# Patient Record
Sex: Female | Born: 1935 | Race: White | Hispanic: No | Marital: Married | Smoking: Never smoker
Health system: Southern US, Community
[De-identification: ages and names within clinical notes are randomized; demographics above are authoritative.]

## PROBLEM LIST (undated history)

## (undated) DIAGNOSIS — N183 Chronic kidney disease, stage 3 unspecified: Secondary | ICD-10-CM

## (undated) DIAGNOSIS — I340 Nonrheumatic mitral (valve) insufficiency: Secondary | ICD-10-CM

## (undated) DIAGNOSIS — I5032 Chronic diastolic (congestive) heart failure: Secondary | ICD-10-CM

## (undated) DIAGNOSIS — M1990 Unspecified osteoarthritis, unspecified site: Secondary | ICD-10-CM

## (undated) DIAGNOSIS — I272 Pulmonary hypertension, unspecified: Secondary | ICD-10-CM

## (undated) DIAGNOSIS — I10 Essential (primary) hypertension: Secondary | ICD-10-CM

## (undated) DIAGNOSIS — S22001A Stable burst fracture of unspecified thoracic vertebra, initial encounter for closed fracture: Secondary | ICD-10-CM

## (undated) DIAGNOSIS — Z85828 Personal history of other malignant neoplasm of skin: Secondary | ICD-10-CM

## (undated) DIAGNOSIS — E7800 Pure hypercholesterolemia, unspecified: Secondary | ICD-10-CM

## (undated) DIAGNOSIS — I4819 Other persistent atrial fibrillation: Secondary | ICD-10-CM

## (undated) DIAGNOSIS — I071 Rheumatic tricuspid insufficiency: Secondary | ICD-10-CM

## (undated) DIAGNOSIS — K5730 Diverticulosis of large intestine without perforation or abscess without bleeding: Secondary | ICD-10-CM

## (undated) DIAGNOSIS — I351 Nonrheumatic aortic (valve) insufficiency: Secondary | ICD-10-CM

## (undated) DIAGNOSIS — E611 Iron deficiency: Secondary | ICD-10-CM

## (undated) DIAGNOSIS — Z9889 Other specified postprocedural states: Secondary | ICD-10-CM

## (undated) HISTORY — PX: HIP SURGERY: SHX245

## (undated) HISTORY — PX: ABDOMINAL HYSTERECTOMY: SHX81

## (undated) HISTORY — DX: Nonrheumatic mitral (valve) insufficiency: I34.0

## (undated) HISTORY — DX: Nonrheumatic aortic (valve) insufficiency: I35.1

## (undated) HISTORY — DX: Rheumatic tricuspid insufficiency: I07.1

## (undated) HISTORY — DX: Chronic diastolic (congestive) heart failure: I50.32

---

## 2011-06-12 HISTORY — PX: TOTAL KNEE ARTHROPLASTY: SHX125

## 2011-11-14 ENCOUNTER — Emergency Department (HOSPITAL_BASED_OUTPATIENT_CLINIC_OR_DEPARTMENT_OTHER)
Admission: EM | Admit: 2011-11-14 | Discharge: 2011-11-14 | Disposition: A | Discharge status: Home / Self Care | Payer: Medicare Other | Attending: Emergency Medicine | Admitting: Emergency Medicine

## 2011-11-14 ENCOUNTER — Emergency Department (HOSPITAL_BASED_OUTPATIENT_CLINIC_OR_DEPARTMENT_OTHER): Payer: Medicare Other

## 2011-11-14 ENCOUNTER — Encounter (HOSPITAL_BASED_OUTPATIENT_CLINIC_OR_DEPARTMENT_OTHER): Payer: Self-pay | Admitting: *Deleted

## 2011-11-14 DIAGNOSIS — I6789 Other cerebrovascular disease: Secondary | ICD-10-CM | POA: Insufficient documentation

## 2011-11-14 DIAGNOSIS — M545 Low back pain, unspecified: Secondary | ICD-10-CM | POA: Insufficient documentation

## 2011-11-14 DIAGNOSIS — S0510XA Contusion of eyeball and orbital tissues, unspecified eye, initial encounter: Secondary | ICD-10-CM | POA: Insufficient documentation

## 2011-11-14 DIAGNOSIS — W06XXXA Fall from bed, initial encounter: Secondary | ICD-10-CM | POA: Insufficient documentation

## 2011-11-14 DIAGNOSIS — W19XXXA Unspecified fall, initial encounter: Secondary | ICD-10-CM

## 2011-11-14 DIAGNOSIS — Y92009 Unspecified place in unspecified non-institutional (private) residence as the place of occurrence of the external cause: Secondary | ICD-10-CM | POA: Insufficient documentation

## 2011-11-14 HISTORY — DX: Other specified postprocedural states: Z98.890

## 2011-11-14 HISTORY — DX: Pure hypercholesterolemia, unspecified: E78.00

## 2011-11-14 HISTORY — DX: Unspecified osteoarthritis, unspecified site: M19.90

## 2011-11-14 NOTE — ED Notes (Signed)
Patient states she was sleeping and rolled out of the bed Thursday morning and fell on face. Came in today because her lower back continues to hurt. States her face does not hurt but has bruising. No loc, not dizzy. No meds taken for pain

## 2011-11-14 NOTE — ED Provider Notes (Signed)
History     CSN: [id number]  Arrival date & time 11/14/11  1102   First MD Initiated Contact with Patient 11/14/11 1116      Chief Complaint  Patient presents with  . Fall    (Consider location/radiation/quality/duration/timing/severity/associated sxs/prior treatment) HPI Comments: The patient is an 78 year old woman who fell out of bed 2 days ago. She didn't feel like she was her significantly and did not seek evaluation then. She followed flat on her face. She was not unconscious. She noted bruising around her right orbit. Now she has bad pain, like spasms, and her lower back. It is noteworthy that she is on Coumadin for atrial fibrillation. She is also undergoing treatment for hypertension and high cholesterol. She has had bilateral total hip surgery, and recently, in March of this year, had right total knee surgery.  Patient is a 78 y.o. female presenting with fall.  Fall The accident occurred 2 days ago. Incident: Rolled out of bed and fell at home. She fell from a height of 1 to 2 ft. She landed on carpet. Point of impact: Face, back. The pain is moderate. She was ambulatory at the scene. There was no entrapment after the fall. There was no drug use involved in the accident. There was no alcohol use involved in the accident. The symptoms are aggravated by activity. She has tried nothing for the symptoms.    History reviewed. No pertinent past medical history.  History reviewed. No pertinent past surgical history.  No family history on file.  History  Substance Use Topics  . Smoking status: Not on file  . Smokeless tobacco: Not on file  . Alcohol Use: Not on file    OB History    Grav Para Term Preterm Abortions TAB SAB Ect Mult Living                  Review of Systems  Constitutional: Negative.   HENT:       Right orbital contusion.  Eyes: Negative.   Respiratory: Negative.   Cardiovascular: Negative.   Gastrointestinal: Negative.   Genitourinary: Negative.     Musculoskeletal: Positive for back pain.  Skin: Negative.   Neurological: Negative.   Psychiatric/Behavioral: Negative.     Allergies  Review of patient's allergies indicates not on file.  Home Medications  No current outpatient prescriptions on file.  BP 120/92  Pulse 79  Temp 98.5 F (36.9 C) (Oral)  Resp 19  SpO2 97%  Physical Exam  Nursing note and vitals reviewed. Constitutional: She is oriented to person, place, and time. She appears well-developed and well-nourished. No distress.  HENT:  Right Ear: External ear normal.  Left Ear: External ear normal.  Nose: Nose normal.  Mouth/Throat: Oropharynx is clear and moist.       She has ecchymosis on the right perioral region. There is no intrinsic eye injury.  Eyes: Conjunctivae and EOM are normal. Pupils are equal, round, and reactive to light.  Neck: Normal range of motion. Neck supple.  Cardiovascular: Normal rate, regular rhythm and normal heart sounds.   Pulmonary/Chest: Effort normal and breath sounds normal.  Abdominal: Soft. Bowel sounds are normal.  Musculoskeletal: Normal range of motion.       Localizes her pain to her upper lumbar region. There is no palpable bony deformity or point tenderness. There is no mark on the skin there.  Neurological: She is alert and oriented to person, place, and time.       No sensory  or motor deficit.  Skin: Skin is warm and dry.  Psychiatric: She has a normal mood and affect. Her behavior is normal.    ED Course  Procedures (including critical care time)  11:42 AM  patient was seen and had physical examination. CT of the head and maxillofacial region were ordered as were x-rays the lumbar spine.  1:38 PM No results found for this or any previous visit. Dg Lumbar Spine Complete  11/14/2011  *RADIOLOGY REPORT*  Clinical Data: Fall  LUMBAR SPINE - COMPLETE 4+ VIEW  Comparison: None  Findings: The patient is noted to have bilateral hip arthroplasty devices.  There is a  scoliosis deformity which appears convex to the right.  Mild multilevel disc space narrowing and ventral endplate spurring is noted.  No fracture or subluxation identified. Advanced calcified atherosclerotic disease affects the abdominal aorta and its branches.  IMPRESSION:  1.  Scoliosis and degenerative disc disease. 2.  No acute findings.  Original Report Authenticated By: Rosealee Albee, M.D.   Ct Head Wo Contrast  11/14/2011  *RADIOLOGY REPORT*  Clinical Data:  Right orbital contusion after fall  CT HEAD WITHOUT CONTRAST CT MAXILLOFACIAL WITHOUT CONTRAST  Technique:  Multidetector CT imaging of the head and maxillofacial structures were performed using the standard protocol without intravenous contrast. Multiplanar CT image reconstructions of the maxillofacial structures were also generated.  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  There is prominence of the sulci and ventricles consistent with brain atrophy.  There is no evidence for acute brain infarct, hemorrhage or mass.  The paranasal sinuses are clear.  The mastoid air cells are clear. The skull is intact.  IMPRESSION:  1.  No acute intracranial abnormalities. 2.  Small vessel ischemic disease and brain atrophy.  CT MAXILLOFACIAL  Findings:   The paranasal sinuses are clear.  The orbits are intact.  No evidence for blowout fracture.  There is mild leftward deviation of the nasal septum.  No facial bone fractures are identified.  IMPRESSION:  1.  No acute findings identified.  Original Report Authenticated By: Rosealee Albee, M.D.   Ct Maxillofacial Wo Cm  11/14/2011  *RADIOLOGY REPORT*  Clinical Data:  Right orbital contusion after fall  CT HEAD WITHOUT CONTRAST CT MAXILLOFACIAL WITHOUT CONTRAST  Technique:  Multidetector CT imaging  of the head and maxillofacial structures were performed using the standard protocol without intravenous contrast. Multiplanar CT image reconstructions of the maxillofacial structures were also generated.  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  There is prominence of the sulci and ventricles consistent with brain atrophy.  There is no evidence for acute brain infarct, hemorrhage or mass.  The paranasal sinuses are clear.  The mastoid air cells are clear. The skull is intact.  IMPRESSION:  1.  No acute intracranial abnormalities. 2.  Small vessel ischemic disease and brain atrophy.  CT MAXILLOFACIAL  Findings:   The paranasal sinuses are clear.  The orbits are intact.  No evidence for blowout fracture.  There is mild leftward deviation of the nasal septum.  No facial bone fractures are identified.  IMPRESSION:  1.  No acute findings identified.  Original Report Authenticated By: Rosealee Albee, M.D.    X-rays showed no intracranial injury, and no facial fracture or back fracture.  She does not want a strong pain medicine, will take Tylenol if needed for pain.   1. Fall   2. Orbital contusion   3. Low back pain        Carleene Cooper III, MD 11/14/11 1341

## 2013-09-27 DIAGNOSIS — M25559 Pain in unspecified hip: Secondary | ICD-10-CM | POA: Insufficient documentation

## 2013-09-27 DIAGNOSIS — E876 Hypokalemia: Secondary | ICD-10-CM | POA: Insufficient documentation

## 2013-09-27 DIAGNOSIS — M25569 Pain in unspecified knee: Secondary | ICD-10-CM | POA: Insufficient documentation

## 2013-09-27 DIAGNOSIS — R7309 Other abnormal glucose: Secondary | ICD-10-CM | POA: Insufficient documentation

## 2013-09-27 DIAGNOSIS — K5909 Other constipation: Secondary | ICD-10-CM | POA: Insufficient documentation

## 2013-09-27 DIAGNOSIS — M810 Age-related osteoporosis without current pathological fracture: Secondary | ICD-10-CM | POA: Insufficient documentation

## 2013-09-27 DIAGNOSIS — S83249A Other tear of medial meniscus, current injury, unspecified knee, initial encounter: Secondary | ICD-10-CM | POA: Insufficient documentation

## 2013-09-27 DIAGNOSIS — R928 Other abnormal and inconclusive findings on diagnostic imaging of breast: Secondary | ICD-10-CM | POA: Insufficient documentation

## 2013-09-27 DIAGNOSIS — M159 Polyosteoarthritis, unspecified: Secondary | ICD-10-CM | POA: Insufficient documentation

## 2013-09-27 DIAGNOSIS — I493 Ventricular premature depolarization: Secondary | ICD-10-CM | POA: Insufficient documentation

## 2013-09-27 DIAGNOSIS — A09 Infectious gastroenteritis and colitis, unspecified: Secondary | ICD-10-CM | POA: Insufficient documentation

## 2013-09-27 DIAGNOSIS — R0689 Other abnormalities of breathing: Secondary | ICD-10-CM | POA: Insufficient documentation

## 2013-09-27 DIAGNOSIS — E781 Pure hyperglyceridemia: Secondary | ICD-10-CM | POA: Insufficient documentation

## 2013-09-27 DIAGNOSIS — E860 Dehydration: Secondary | ICD-10-CM | POA: Insufficient documentation

## 2013-09-27 DIAGNOSIS — I10 Essential (primary) hypertension: Secondary | ICD-10-CM | POA: Insufficient documentation

## 2013-09-27 DIAGNOSIS — N6019 Diffuse cystic mastopathy of unspecified breast: Secondary | ICD-10-CM | POA: Insufficient documentation

## 2013-09-27 DIAGNOSIS — L259 Unspecified contact dermatitis, unspecified cause: Secondary | ICD-10-CM | POA: Insufficient documentation

## 2013-09-27 DIAGNOSIS — R630 Anorexia: Secondary | ICD-10-CM | POA: Insufficient documentation

## 2013-09-27 DIAGNOSIS — K5790 Diverticulosis of intestine, part unspecified, without perforation or abscess without bleeding: Secondary | ICD-10-CM | POA: Insufficient documentation

## 2013-09-27 DIAGNOSIS — M7989 Other specified soft tissue disorders: Secondary | ICD-10-CM | POA: Insufficient documentation

## 2013-09-27 DIAGNOSIS — R079 Chest pain, unspecified: Secondary | ICD-10-CM | POA: Insufficient documentation

## 2013-09-27 DIAGNOSIS — C4491 Basal cell carcinoma of skin, unspecified: Secondary | ICD-10-CM | POA: Insufficient documentation

## 2013-09-27 DIAGNOSIS — D509 Iron deficiency anemia, unspecified: Secondary | ICD-10-CM | POA: Insufficient documentation

## 2013-09-27 DIAGNOSIS — R0989 Other specified symptoms and signs involving the circulatory and respiratory systems: Secondary | ICD-10-CM | POA: Insufficient documentation

## 2013-09-27 DIAGNOSIS — M2240 Chondromalacia patellae, unspecified knee: Secondary | ICD-10-CM | POA: Insufficient documentation

## 2013-10-11 DIAGNOSIS — Z7901 Long term (current) use of anticoagulants: Secondary | ICD-10-CM | POA: Insufficient documentation

## 2013-10-11 DIAGNOSIS — I8000 Phlebitis and thrombophlebitis of superficial vessels of unspecified lower extremity: Secondary | ICD-10-CM | POA: Insufficient documentation

## 2013-10-11 DIAGNOSIS — Z9229 Personal history of other drug therapy: Secondary | ICD-10-CM | POA: Insufficient documentation

## 2013-10-11 DIAGNOSIS — L03119 Cellulitis of unspecified part of limb: Secondary | ICD-10-CM | POA: Insufficient documentation

## 2014-03-24 ENCOUNTER — Emergency Department (HOSPITAL_BASED_OUTPATIENT_CLINIC_OR_DEPARTMENT_OTHER)
Admission: EM | Admit: 2014-03-24 | Discharge: 2014-03-24 | Disposition: A | Discharge status: Home / Self Care | Payer: Medicare Other | Attending: Emergency Medicine | Admitting: Emergency Medicine

## 2014-03-24 ENCOUNTER — Emergency Department (HOSPITAL_BASED_OUTPATIENT_CLINIC_OR_DEPARTMENT_OTHER): Payer: Medicare Other

## 2014-03-24 ENCOUNTER — Encounter (HOSPITAL_BASED_OUTPATIENT_CLINIC_OR_DEPARTMENT_OTHER): Payer: Self-pay | Admitting: *Deleted

## 2014-03-24 DIAGNOSIS — Z7901 Long term (current) use of anticoagulants: Secondary | ICD-10-CM | POA: Diagnosis not present

## 2014-03-24 DIAGNOSIS — Z9889 Other specified postprocedural states: Secondary | ICD-10-CM | POA: Insufficient documentation

## 2014-03-24 DIAGNOSIS — M1990 Unspecified osteoarthritis, unspecified site: Secondary | ICD-10-CM | POA: Insufficient documentation

## 2014-03-24 DIAGNOSIS — E780 Pure hypercholesterolemia: Secondary | ICD-10-CM | POA: Insufficient documentation

## 2014-03-24 DIAGNOSIS — R009 Unspecified abnormalities of heart beat: Secondary | ICD-10-CM | POA: Diagnosis present

## 2014-03-24 DIAGNOSIS — I10 Essential (primary) hypertension: Secondary | ICD-10-CM | POA: Diagnosis not present

## 2014-03-24 DIAGNOSIS — I480 Paroxysmal atrial fibrillation: Secondary | ICD-10-CM

## 2014-03-24 DIAGNOSIS — Z791 Long term (current) use of non-steroidal anti-inflammatories (NSAID): Secondary | ICD-10-CM | POA: Insufficient documentation

## 2014-03-24 DIAGNOSIS — I253 Aneurysm of heart: Secondary | ICD-10-CM

## 2014-03-24 DIAGNOSIS — I729 Aneurysm of unspecified site: Secondary | ICD-10-CM | POA: Insufficient documentation

## 2014-03-24 DIAGNOSIS — Z79899 Other long term (current) drug therapy: Secondary | ICD-10-CM | POA: Diagnosis not present

## 2014-03-24 LAB — PROTIME-INR
INR: 2.77 — ABNORMAL HIGH (ref 0.00–1.49)
PROTHROMBIN TIME: 29.3 s — AB (ref 11.6–15.2)

## 2014-03-24 LAB — BASIC METABOLIC PANEL
Anion gap: 17 — ABNORMAL HIGH (ref 5–15)
BUN: 30 mg/dL — ABNORMAL HIGH (ref 6–23)
CALCIUM: 9.5 mg/dL (ref 8.4–10.5)
CO2: 23 mEq/L (ref 19–32)
Chloride: 100 mEq/L (ref 96–112)
Creatinine, Ser: 1 mg/dL (ref 0.50–1.10)
GFR calc Af Amer: 57 mL/min — ABNORMAL LOW (ref >90)
GFR, EST NON AFRICAN AMERICAN: 49 mL/min — AB (ref >90)
GLUCOSE: 120 mg/dL — AB (ref 70–99)
Potassium: 3.9 mEq/L (ref 3.7–5.3)
SODIUM: 140 meq/L (ref 137–147)

## 2014-03-24 LAB — CBC WITH DIFFERENTIAL/PLATELET
Basophils Absolute: 0 10*3/uL (ref 0.0–0.1)
Basophils Relative: 0 % (ref 0–1)
EOS ABS: 0 10*3/uL (ref 0.0–0.7)
EOS PCT: 1 % (ref 0–5)
HCT: 43.5 % (ref 36.0–46.0)
Hemoglobin: 14.5 g/dL (ref 12.0–15.0)
LYMPHS ABS: 0.9 10*3/uL (ref 0.7–4.0)
Lymphocytes Relative: 13 % (ref 12–46)
MCH: 31.5 pg (ref 26.0–34.0)
MCHC: 33.3 g/dL (ref 30.0–36.0)
MCV: 94.4 fL (ref 78.0–100.0)
Monocytes Absolute: 0.6 10*3/uL (ref 0.1–1.0)
Monocytes Relative: 9 % (ref 3–12)
Neutro Abs: 5.2 10*3/uL (ref 1.7–7.7)
Neutrophils Relative %: 77 % (ref 43–77)
PLATELETS: 107 10*3/uL — AB (ref 150–400)
RBC: 4.61 MIL/uL (ref 3.87–5.11)
RDW: 14 % (ref 11.5–15.5)
WBC: 6.7 10*3/uL (ref 4.0–10.5)

## 2014-03-24 LAB — PRO B NATRIURETIC PEPTIDE: Pro B Natriuretic peptide (BNP): 3197 pg/mL — ABNORMAL HIGH (ref 0–450)

## 2014-03-24 LAB — TROPONIN I: Troponin I: <0.3 ng/mL (ref <0.30)

## 2014-03-24 MED ORDER — ALPRAZOLAM 0.25 MG PO TABS: 0.2500 mg | ORAL_TABLET | Freq: Every evening | ORAL | Status: DC | PRN

## 2014-03-24 NOTE — ED Notes (Signed)
EMS states pt just moved to [place] yesterday, last night began feeling like "her heart was racing", this morning same, called EMS. Per EMS patient stated that she felt much better when they arrived. Pt has a history of A-fib. Denies chest pain.

## 2014-03-24 NOTE — Discharge Instructions (Signed)
Take 1 10mg  tablet of your Bystolic in the evening. Xanax at bedtime as needed for sleep or anxiety. If your heart rate becomes fast again between now and Monday, please call Dr. Atilano Median on call at [facility].  Atrial Fibrillation Atrial fibrillation is a type of irregular heart rhythm (arrhythmia). During atrial fibrillation, the upper chambers of the heart (atria) quiver continuously in a chaotic pattern. This causes an irregular and often rapid heart rate.  Atrial fibrillation is the result of the heart becoming overloaded with disorganized signals that tell it to beat. These signals are normally released one at a time by a part of the right atrium called the sinoatrial node. They then travel from the atria to the lower chambers of the heart (ventricles), causing the atria and ventricles to contract and pump blood as they pass. In atrial fibrillation, parts of the atria outside of the sinoatrial node also release these signals. This results in two problems. First, the atria receive so many signals that they do not have time to fully contract. Second, the ventricles, which can only receive one signal at a time, beat irregularly and out of rhythm with the atria.  There are three types of atrial fibrillation:   Paroxysmal. Paroxysmal atrial fibrillation starts suddenly and stops on its own within a week.  Persistent. Persistent atrial fibrillation lasts for more than a week. It may stop on its own or with treatment.  Permanent. Permanent atrial fibrillation does not go away. Episodes of atrial fibrillation may lead to permanent atrial fibrillation. Atrial fibrillation can prevent your heart from pumping blood normally. It increases your risk of stroke and can lead to heart failure.  CAUSES   Heart conditions, including a heart attack, heart failure, coronary artery disease, and heart valve conditions.   Inflammation of the sac that surrounds the heart (pericarditis).  Blockage of an  artery in the lungs (pulmonary embolism).  Pneumonia or other infections.  Chronic lung disease.  Thyroid problems, especially if the thyroid is overactive (hyperthyroidism).  Caffeine, excessive alcohol use, and use of some illegal drugs.   Use of some medicines, including certain decongestants and diet pills.  Heart surgery.   Birth defects.  Sometimes, no cause can be found. When this happens, the atrial fibrillation is called lone atrial fibrillation. The risk of complications from atrial fibrillation increases if you have lone atrial fibrillation and you are age 64 years or older. RISK FACTORS  Heart failure.  Coronary artery disease.  Diabetes mellitus.   High blood pressure (hypertension).   Obesity.   Other arrhythmias.   Increased age. SIGNS AND SYMPTOMS   A feeling that your heart is beating rapidly or irregularly.   A feeling of discomfort or pain in your chest.   Shortness of breath.   Sudden light-headedness or weakness.   Getting tired easily when exercising.   Urinating more often than normal (mainly when atrial fibrillation first begins).  In paroxysmal atrial fibrillation, symptoms may start and suddenly stop. DIAGNOSIS  Your health care provider may be able to detect atrial fibrillation when taking your pulse. Your health care provider may have you take a test called an ambulatory electrocardiogram (ECG). An ECG records your heartbeat patterns over a 24-hour period. You may also have other tests, such as:  Transthoracic echocardiogram (TTE). During echocardiography, sound waves are used to evaluate how blood flows through your heart.  Transesophageal echocardiogram (TEE).  Stress test. There is more than one type of stress test. If a stress  test is needed, ask your health care provider about which type is best for you.  Chest X-ray exam.  Blood tests.  Computed tomography (CT). TREATMENT  Treatment may include:  Treating any  underlying conditions. For example, if you have an overactive thyroid, treating the condition may correct atrial fibrillation.  Taking medicine. Medicines may be given to control a rapid heart rate or to prevent blood clots, heart failure, or a stroke.  Having a procedure to correct the rhythm of the heart:  Electrical cardioversion. During electrical cardioversion, a controlled, low-energy shock is delivered to the heart through your skin. If you have chest pain, very low blood pressure, or sudden heart failure, this procedure may need to be done as an emergency.  Catheter ablation. During this procedure, heart tissues that send the signals that cause atrial fibrillation are destroyed.  Surgical ablation. During this surgery, thin lines of heart tissue that carry the abnormal signals are destroyed. This procedure can either be an open-heart surgery or a minimally invasive surgery. With the minimally invasive surgery, small cuts are made to access the heart instead of a large opening.  Pulmonary venous isolation. During this surgery, tissue around the veins that carry blood from the lungs (pulmonary veins) is destroyed. This tissue is thought to carry the abnormal signals. HOME CARE INSTRUCTIONS   Take medicines only as directed by your health care provider. Some medicines can make atrial fibrillation worse or recur.  If blood thinners were prescribed by your health care provider, take them exactly as directed. Too much blood-thinning medicine can cause bleeding. If you take too little, you will not have the needed protection against stroke and other problems.  Perform blood tests at home if directed by your health care provider. Perform blood tests exactly as directed.  Quit smoking if you smoke.  Do not drink alcohol.  Do not drink caffeinated beverages such as coffee, soda, and some teas. You may drink decaffeinated coffee, soda, or tea.   Maintain a healthy weight.Do not use diet  pills unless your health care provider approves. They may make heart problems worse.   Follow diet instructions as directed by your health care provider.  Exercise regularly as directed by your health care provider.  Keep all follow-up visits as directed by your health care provider. This is important. PREVENTION  The following substances can cause atrial fibrillation to recur:   Caffeinated beverages.  Alcohol.  Certain medicines, especially those used for breathing problems.  Certain herbs and herbal medicines, such as those containing ephedra or ginseng.  Illegal drugs, such as cocaine and amphetamines. Sometimes medicines are given to prevent atrial fibrillation from recurring. Proper treatment of any underlying condition is also important in helping prevent recurrence.  SEEK MEDICAL CARE IF:  You notice a change in the rate, rhythm, or strength of your heartbeat.  You suddenly begin urinating more frequently.  You tire more easily when exerting yourself or exercising. SEEK IMMEDIATE MEDICAL CARE IF:   You have chest pain, abdominal pain, sweating, or weakness.  You feel nauseous.  You have shortness of breath.  You suddenly have swollen feet and ankles.  You feel dizzy.  Your face or limbs feel numb or weak.  You have a change in your vision or speech. MAKE SURE YOU:   Understand these instructions.  Will watch your condition.  Will get help right away if you are not doing well or get worse. Document Released: 03/30/2005 Document Revised: 08/14/2013 Document Reviewed: 05/10/2012 ExitCare  Patient Information 2015 [employer]. This information is not intended to replace advice given to you by your health care provider. Make sure you discuss any questions you have with your health care provider.

## 2014-03-24 NOTE — ED Provider Notes (Signed)
CSN: [id number]     Arrival date & time 03/24/14  0810 History   First MD Initiated Contact with Patient 03/24/14 0815     Chief Complaint  Patient presents with  . Irregular Heart Beat      HPI  Cardiologist is Dr. Mahala Menghini at [facility], [facility].   Patient reports a history of atrial fibrillation. States she was last seen by her cardiology over a year ago. Patient's and her first night in a new assisted living facility last night. Late last night began feeling like her heart was racing. This persisted. She took her morning medications including Coumadin, and by systolic at 05 009. Within a few hours began feeling better. Contacted her son this morning. Presents here by ambulance stating she feels "much better".  States that she is "pretty sure" that she is not in atrial fibrillation on the time, but admits some uncertainty. No recent shortness of breath chest pain peripheral edema fevers chills cough sputum nocturia PND or orthopnea.  Past Medical History  Diagnosis Date  . Arthritis   . Hypertension   . Hx of right knee surgery   . Atrial fib/flutter, transient   . High cholesterol    Past Surgical History  Procedure Laterality Date  . Hip surgery      bilateral  . Abdominal hysterectomy    . Cesarean section     No family history on file. History  Substance Use Topics  . Smoking status: Never Smoker   . Smokeless tobacco: Not on file  . Alcohol Use: No   OB History    No data available     Review of Systems  Constitutional: Negative for fever, chills, diaphoresis, appetite change and fatigue.  HENT: Negative for mouth sores, sore throat and trouble swallowing.   Eyes: Negative for visual disturbance.  Respiratory: Negative for cough, chest tightness, shortness of breath and wheezing.   Cardiovascular: Positive for palpitations. Negative for chest pain.  Gastrointestinal: Negative for nausea, vomiting, abdominal pain, diarrhea and abdominal  distention.  Endocrine: Negative for polydipsia, polyphagia and polyuria.  Genitourinary: Negative for dysuria, frequency and hematuria.  Musculoskeletal: Negative for gait problem.  Skin: Negative for color change, pallor and rash.  Neurological: Negative for dizziness, syncope, light-headedness and headaches.  Hematological: Does not bruise/bleed easily.  Psychiatric/Behavioral: Negative for behavioral problems and confusion.      Allergies  Codeine and Morphine and related  Home Medications   Prior to Admission medications   Medication Sig Start Date End Date Taking? Authorizing Provider  cloNIDine (CATAPRES) 0.2 MG tablet Take 0.2 mg by mouth 2 (two) times daily.   Yes Historical Provider, MD  losartan (COZAAR) 50 MG tablet Take 50 mg by mouth daily.   Yes Historical Provider, MD  potassium chloride SA (K-DUR,KLOR-CON) 20 MEQ tablet Take 20 mEq by mouth 2 (two) times daily.   Yes Historical Provider, MD  ALPRAZolam Duanne Moron) 0.25 MG tablet Take 1 tablet (0.25 mg total) by mouth at bedtime as needed for anxiety or sleep. 03/24/14   Tanna Furry, MD  calcium carbonate 200 MG capsule Take 250 mg by mouth 2 (two) times daily with a meal.    Historical Provider, MD  celecoxib (CELEBREX) 200 MG capsule Take 200 mg by mouth 2 (two) times daily.    Historical Provider, MD  fish oil-omega-3 fatty acids 1000 MG capsule Take 2 g by mouth daily.    Historical Provider, MD  lisinopril-hydrochlorothiazide (PRINZIDE,ZESTORETIC) 20-12.5 MG per tablet Take  1 tablet by mouth daily.    Historical Provider, MD  nebivolol (BYSTOLIC) 10 MG tablet Take 30 mg by mouth daily.     Historical Provider, MD  simvastatin (ZOCOR) 40 MG tablet Take 40 mg by mouth every evening.    Historical Provider, MD  warfarin (COUMADIN) 1 MG tablet Take 5 mg by mouth as directed. mon - Sat 5mg , Sun 7.5mg     Historical Provider, MD   BP 197/109 mmHg  Pulse 87  Temp(Src) 98.6 F (37 C) (Oral)  Resp 14  SpO2 94% Physical Exam   Constitutional: She is oriented to person, place, and time. She appears well-developed and well-nourished. No distress.  HENT:  Head: Normocephalic.  Eyes: Conjunctivae are normal. Pupils are equal, round, and reactive to light. No scleral icterus.  Neck: Normal range of motion. Neck supple. No thyromegaly present.  Cardiovascular: Normal rate.  An irregularly irregular rhythm present. Exam reveals no gallop and no friction rub.   No murmur heard. A. fib on the monitor. Rate in the 80s. No gallop. No peripheral edema. Clear lungs without abnormal breath sounds wheezing rales rhonchi.  Pulmonary/Chest: Effort normal and breath sounds normal. No respiratory distress. She has no wheezes. She has no rales.  Abdominal: Soft. Bowel sounds are normal. She exhibits no distension. There is no tenderness. There is no rebound.  Musculoskeletal: Normal range of motion.  Neurological: She is alert and oriented to person, place, and time.  Skin: Skin is warm and dry. No rash noted.  Psychiatric: She has a normal mood and affect. Her behavior is normal.    ED Course  Procedures (including critical care time) Labs Review Labs Reviewed  CBC WITH DIFFERENTIAL - Abnormal; Notable for the following:    Platelets 107 (*)    All other components within normal limits  BASIC METABOLIC PANEL - Abnormal; Notable for the following:    Glucose, Bld 120 (*)    BUN 30 (*)    GFR calc non Af Amer 49 (*)    GFR calc Af Amer 57 (*)    Anion gap 17 (*)    All other components within normal limits  PROTIME-INR - Abnormal; Notable for the following:    Prothrombin Time 29.3 (*)    INR 2.77 (*)    All other components within normal limits  TROPONIN I  PRO B NATRIURETIC PEPTIDE    Imaging Review Dg Chest 2 View  03/24/2014   CLINICAL DATA:  Palpitations and anxiety which began yesterday when the patient moved into a new living facility. Current history of hypertension and atrial fibrillation.  EXAM: CHEST  2  VIEW  COMPARISON:  None.  FINDINGS: Cardiac silhouette moderately enlarged. Thoracic aorta tortuous and atherosclerotic. Hilar and mediastinal contours otherwise unremarkable. Pulmonary venous hypertension and mild diffuse interstitial pulmonary edema as evidenced by Kerley B-lines. No confluent airspace consolidation. No pleural effusions. Generalized osseous demineralization and likely old compression fractures of what I believe are the T8 and T9 vertebral bodies.  IMPRESSION: Mild CHF, with moderate cardiomegaly and mild diffuse interstitial pulmonary edema.   Electronically Signed   By: Evangeline Dakin M.D.   On: 03/24/2014 09:31     EKG Interpretation   Date/Time:  Saturday March 24 2014 08:23:33 EST Ventricular Rate:  78 PR Interval:    QRS Duration: 92 QT Interval:  378 QTC Calculation: 430 R Axis:   80 Text Interpretation:  Atrial fibrillation Abnormal ECG Confirmed by Jeneen Rinks   MD, [place] ([id number]) on 03/24/2014  8:28:56 AM      MDM    Diagnosis: Atrial fibrillation  Patient H her fibrillation with controlled rate here. From her description a [place] that she was very likely an rapid response before taking her by systolic. She is tolerating her rate here quite well. Recheck blood pressure 150/92. Saturations ranged from 92-96 on room air. She has clear lungs and is clinically not in congestive heart failure. Discussed the case with Dr. Atilano Median on call for [facility]. He felt that the patient could be safely discharged as she is appropriately anticoagulated and rate controlled. His preference was to see her in their walk-in clinic on Monday to review her cardiac anatomy and echocardiogram and to further discuss need and coordination for cardioversion. She is to contact Dr. Alfredia Ferguson on call this weekend with any episodes of rapid response. I given her a prescription for 0.25 mg and excuse at night for sleep or anxiety as she admits she has not slept for the last 2-3 nights. Last  night of think was clearly because of her tachycardia. A dose of her by Pearletha Furl at Night, and continue regular morning dose.    Tanna Furry, MD 03/24/14 1019

## 2014-04-27 ENCOUNTER — Ambulatory Visit (INDEPENDENT_AMBULATORY_CARE_PROVIDER_SITE_OTHER): Payer: Medicare Other | Admitting: Cardiology

## 2014-04-27 ENCOUNTER — Encounter: Payer: Self-pay | Admitting: Cardiology

## 2014-04-27 VITALS — BP 180/80 | HR 60 | Ht 66.0 in | Wt 162.0 lb

## 2014-04-27 DIAGNOSIS — R079 Chest pain, unspecified: Secondary | ICD-10-CM

## 2014-04-27 DIAGNOSIS — E785 Hyperlipidemia, unspecified: Secondary | ICD-10-CM

## 2014-04-27 DIAGNOSIS — I10 Essential (primary) hypertension: Secondary | ICD-10-CM

## 2014-04-27 DIAGNOSIS — I4891 Unspecified atrial fibrillation: Secondary | ICD-10-CM

## 2014-04-27 MED ORDER — DILTIAZEM HCL 60 MG PO TABS: 60.0000 mg | ORAL_TABLET | ORAL | Status: DC | PRN

## 2014-04-27 MED ORDER — CLONIDINE HCL 0.3 MG PO TABS: 0.3000 mg | ORAL_TABLET | Freq: Every day | ORAL | Status: DC

## 2014-04-27 NOTE — Patient Instructions (Signed)
Your physician has recommended you make the following change in your medication:   START TAKING CLONIDINE 0.3 MG ONCE DAILY  DR NELSON HAS PRESCRIBED YOU CARDIZEM 60 MG TO TAKE AS NEEDED FOR EPISODES OF AFIB    Your physician has requested that you have an echocardiogram. Echocardiography is a painless test that uses sound waves to create images of your heart. It provides your doctor with information about the size and shape of your heart and how well your heart's chambers and valves are working. This procedure takes approximately one hour. There are no restrictions for this procedure.    Your physician recommends that you schedule a follow-up appointment in: [place]

## 2014-04-27 NOTE — Progress Notes (Signed)
xxxxxxxxxxxxxxxxxxxx  Patient ID: Redonna Wilbert, female   DOB: 08/14/1924, 81 y.o.   MRN: [id number]    Patient Name: Tammy Vaughn Date of Encounter: 04/27/2014  Primary Care Provider:  Maylon Peppers, MD Primary Cardiologist:  Dorothy Spark  Problem List   Past Medical History  Diagnosis Date  . Arthritis   . Hypertension   . Hx of right knee surgery   . Atrial fib/flutter, transient   . High cholesterol    Past Surgical History  Procedure Laterality Date  . Hip surgery      bilateral  . Abdominal hysterectomy    . Cesarean section     Allergies  Allergies  Allergen Reactions  . Codeine   . Morphine And Related   . Amlodipine Other (See Comments)    HPI  81 year old female who is very functional and just recently moved to assisted living, with prior medical history of hypertension and chronic persistent atrial fibrillation is coming for second complete opinion in regards to placement of a pacemaker. The patient hasn't had known atrial fibrillation for at least 10 years and has been anticoagulated warfarin. She occasionally get palpitations that are uncomfortable for her but are not associated with dizziness or shortness of breath. She is very active can walk about an hour a day and denies any falls. She is very compliant with her medications. Her blood pressure significantly elevated on today but she doesn't check it at home. She denies any lower extremity edema, claudications, orthopnea or paroxysmal nocturnal dyspnea she also denies chest pain.  Home Medications  Prior to Admission medications   Medication Sig Start Date End Date Taking? Authorizing Provider  ALPRAZolam Duanne Moron) 0.25 MG tablet Take 1 tablet (0.25 mg total) by mouth at bedtime as needed for anxiety or sleep. 03/24/14  Yes Tanna Furry, MD  celecoxib (CELEBREX) 200 MG capsule Take 200 mg by mouth daily.    Yes Historical Provider, MD  cloNIDine (CATAPRES) 0.2 MG tablet Take 0.2 mg by mouth daily.     Yes Historical Provider, MD  cloNIDine (CATAPRES) 0.2 MG tablet TAKE 1 TABLET BY MOUTH EVERY NIGHT AT BEDTIME 03/22/14  Yes Historical Provider, MD  lisinopril-hydrochlorothiazide (PRINZIDE,ZESTORETIC) 20-12.5 MG per tablet Take 2 tablets by mouth daily.    Yes Historical Provider, MD  Losartan Potassium (COZAAR PO) Take 20 mg by mouth daily.   Yes Historical Provider, MD  nebivolol (BYSTOLIC) 10 MG tablet TAKE 3 TABLETS BY MOUTH EVERY DAY 10/19/13  Yes Historical Provider, MD  potassium chloride SA (K-DUR,KLOR-CON) 20 MEQ tablet Take 20 mEq by mouth 2 (two) times daily.  03/28/14  Yes Historical Provider, MD  simvastatin (ZOCOR) 20 MG tablet TAKE 1 TABLET BY MOUTH EVERY DAY 01/10/14  Yes Historical Provider, MD  warfarin (COUMADIN) 1 MG tablet Take 5 mg by mouth as directed. mon - Sat 5mg , Sun 7.5mg    Yes Historical Provider, MD    Family History  Family History  Problem Relation Age of Onset  . Stroke Mother   . Heart attack Neg Hx   . Hypertension Brother     Social History  History   Social History  . Marital Status: Married    Spouse Name: N/A    Number of Children: N/A  . Years of Education: N/A   Occupational History  . Not on file.   Social History Main Topics  . Smoking status: Never Smoker   . Smokeless tobacco: Not on file  . Alcohol Use: No  . Drug  Use: No  . Sexual Activity: Not on file   Other Topics Concern  . Not on file   Social History Narrative     Review of Systems, as per HPI, otherwise negative General:  No chills, fever, night sweats or weight changes.  Cardiovascular:  No chest pain, dyspnea on exertion, edema, orthopnea, palpitations, paroxysmal nocturnal dyspnea. Dermatological: No rash, lesions/masses Respiratory: No cough, dyspnea Urologic: No hematuria, dysuria Abdominal:   No nausea, vomiting, diarrhea, bright red blood per rectum, melena, or hematemesis Neurologic:  No visual changes, wkns, changes in mental status. All other systems  reviewed and are otherwise negative except as noted above.  Physical Exam  Blood pressure 180/80, pulse 60, height 5\' 6"  (1.676 m), weight 162 lb (73.483 kg).  General: Pleasant, NAD Psych: Normal affect. Neuro: Alert and oriented X 3. Moves all extremities spontaneously. HEENT: Normal  Neck: Supple without bruits or JVD. Lungs:  Resp regular and unlabored, CTA. Heart: Irregularly irregular, no s3, s4, or murmurs. Abdomen: Soft, non-tender, non-distended, BS + x 4.  Extremities: No clubbing, cyanosis or edema. DP/PT/Radials 2+ and equal bilaterally.  Labs:  No results for input(s): CKTOTAL, CKMB, TROPONINI in the last 72 hours. Lab Results  Component Value Date   WBC 6.7 03/24/2014   HGB 14.5 03/24/2014   HCT 43.5 03/24/2014   MCV 94.4 03/24/2014   PLT 107* 03/24/2014    No results found for: DDIMER Invalid input(s): POCBNP    Component Value Date/Time   NA 140 03/24/2014 0845   K 3.9 03/24/2014 0845   CL 100 03/24/2014 0845   CO2 23 03/24/2014 0845   GLUCOSE 120* 03/24/2014 0845   BUN 30* 03/24/2014 0845   CREATININE 1.00 03/24/2014 0845   CALCIUM 9.5 03/24/2014 0845   GFRNONAA 49* 03/24/2014 0845   GFRAA 57* 03/24/2014 0845   No results found for: CHOL, HDL, LDLCALC, TRIG  Accessory Clinical Findings  echocardiogram  ECG - atrial fibrillation, 60 bpm, abnormal EKG.    Assessment & Plan  81 year old female  1. Chronic persistent atrial fibrillation, on chronic anticoagulation with warfarin no risk of falls at this point patient very active. She occasionally get palpitations but I assume is A. fib with RVR. I would prescribe short acting diltiazem 60 mg when necessary.  I don't see at this point any indication for placement of permanent pacemaker as she doesn't have any pauses, presyncope or syncopal episodes. The only possible reason would be a placement of biventricular pacemaker however she has no symptoms of congestive heart failure. We'll order an  echocardiogram to evaluate for systolic and diastolic function also for him degree of left atrial enlargement.  2. Hypertension, uncontrolled we will increase clonidine to 0.3 mg orally daily. We will order echocardiogram to evaluate for the degree of LVH and Lovena Le therapy based on results.  3. Hyperlipidemia - continue simvastatin.  Follow up in 2 months.    Dorothy Spark, MD, [facility] 04/27/2014, 11:14 AM

## 2014-05-03 ENCOUNTER — Telehealth: Payer: Self-pay | Admitting: Cardiology

## 2014-05-03 NOTE — Telephone Encounter (Signed)
Pt calling stating that [employer] sent her a letter in the mail stating that they would no longer cover her Bystolic 10 mg after her 30 day supply is used up.  Pt states she has enough Bystolic to last her 30 more days, but would just like Dr Meda Coffee to advise on a different medication in place of this.  Offered pt that we can do a prior auth on this medication to get approved, but her pharmacy or insurance company would need to fax this paperwork to our office.  Pt states she would rather not have them do this, she would just like for Dr Meda Coffee to advise on something else to take.  Pt states this is not emergent for she has a month supply of the Bystolic left.  Informed the pt that Dr Meda Coffee is currently out of the office the rest of the week, but I will send a message to her for further review and recommendation and follow-up thereafter.  Pt verbalized understanding and agrees with this plan.

## 2014-05-03 NOTE — Telephone Encounter (Signed)
New message      [employer] will no longer pay for her bystolic.  Is there something else she can take?

## 2014-05-06 NOTE — Telephone Encounter (Signed)
I would recommend to start carvedilol 12.5 mg po BID.

## 2014-05-07 MED ORDER — CARVEDILOL 12.5 MG PO TABS: 12.5000 mg | ORAL_TABLET | Freq: Two times a day (BID) | ORAL | Status: DC

## 2014-05-07 NOTE — Telephone Encounter (Signed)
Per Dr. Meda Coffee can take Carvedilol 12.5 mg BID in place of Bystolic.  Will send to pharmacy- 30 day supply per pt for now. She will finish her supply of Bystolic (19 days left) and then start Carvedilol.

## 2014-05-11 ENCOUNTER — Ambulatory Visit (HOSPITAL_COMMUNITY): Payer: Medicare Other | Attending: Internal Medicine | Admitting: Cardiology

## 2014-05-11 DIAGNOSIS — I4891 Unspecified atrial fibrillation: Secondary | ICD-10-CM | POA: Diagnosis present

## 2014-05-11 DIAGNOSIS — E785 Hyperlipidemia, unspecified: Secondary | ICD-10-CM | POA: Diagnosis not present

## 2014-05-11 DIAGNOSIS — I10 Essential (primary) hypertension: Secondary | ICD-10-CM | POA: Diagnosis not present

## 2014-05-11 DIAGNOSIS — R079 Chest pain, unspecified: Secondary | ICD-10-CM

## 2014-05-11 NOTE — Progress Notes (Signed)
Echo performed. 

## 2014-05-15 ENCOUNTER — Telehealth: Payer: Self-pay | Admitting: *Deleted

## 2014-05-15 MED ORDER — CARVEDILOL 25 MG PO TABS: 25.0000 mg | ORAL_TABLET | Freq: Two times a day (BID) | ORAL | Status: DC

## 2014-05-15 MED ORDER — ISOSORBIDE MONONITRATE ER 30 MG PO TB24: 30.0000 mg | ORAL_TABLET | Freq: Every day | ORAL | Status: DC

## 2014-05-15 NOTE — Telephone Encounter (Signed)
She shouldn't take two betablockers, instead we will increase carvedilol to 25 mg po BID.

## 2014-05-15 NOTE — Telephone Encounter (Signed)
Informed the pt that per Dr Meda Coffee she should not take the Metoprolol tartrate, for she does not need to be on 2 beta blockers at one time.  Informed the pt that per Dr Meda Coffee we will increase her carvedilol to 25 mg po bid and add imdur 30 mg po once daily.  Confirmed the pharmacy of choice with the pt.  Informed the pt that I will mail a current med list to her home to reference to.  Pt verbalized understanding of instruction given and agrees with this plan.

## 2014-05-15 NOTE — Telephone Encounter (Signed)
-----   Message from Dorothy Spark, MD sent at 05/11/2014  6:43 PM EST ----- Could you please add imdur 30 mg po daily to her medication regimen, that would help with hypertension and pulmonary hypertension that was seen on the echocardiogram. I will explain her details at the next visit.

## 2014-05-15 NOTE — Telephone Encounter (Signed)
Calling the pt to inform her of her echo results and new order to add imdur 30 mg po daily per Dr Meda Coffee, for pulmonary HTN.  Per the pt she states she went to see her PCP last week and he added a new med Metoprolol tartrate 50 mg po bid, in addition to her carvedilol 12.5 mg po bid.  Pt concerned about being on 2 different beta blockers.  Pt would like for Dr Meda Coffee to review these changes and advise on whether she should be on 2 different beta blockers, in addition with the Imdur 30 mg po daily.  Informed the pt that I will notify Dr Meda Coffee of this and follow-up with recommendations thereafter. Pt verbalized understanding and agrees with this plan.

## 2014-06-22 ENCOUNTER — Encounter: Payer: Self-pay | Admitting: Cardiology

## 2014-06-22 ENCOUNTER — Ambulatory Visit (INDEPENDENT_AMBULATORY_CARE_PROVIDER_SITE_OTHER): Payer: Medicare Other | Admitting: Cardiology

## 2014-06-22 VITALS — BP 172/78 | HR 74 | Ht 64.0 in | Wt 162.0 lb

## 2014-06-22 DIAGNOSIS — I481 Persistent atrial fibrillation: Secondary | ICD-10-CM | POA: Diagnosis not present

## 2014-06-22 DIAGNOSIS — I270 Primary pulmonary hypertension: Secondary | ICD-10-CM

## 2014-06-22 DIAGNOSIS — I10 Essential (primary) hypertension: Secondary | ICD-10-CM

## 2014-06-22 DIAGNOSIS — I272 Pulmonary hypertension, unspecified: Secondary | ICD-10-CM

## 2014-06-22 DIAGNOSIS — I4819 Other persistent atrial fibrillation: Secondary | ICD-10-CM

## 2014-06-22 NOTE — Progress Notes (Signed)
Patient ID: Tammy Vaughn, female   DOB: 1924/07/15, 80 y.o.   MRN: [id number] [id number]    Patient Name: Tammy Vaughn Date of Encounter: 06/22/2014  Primary Care Provider:  Maylon Peppers, MD Primary Cardiologist:  Dorothy Spark  Problem List   Past Medical History  Diagnosis Date  . Arthritis   . Hypertension   . Hx of right knee surgery   . Atrial fib/flutter, transient   . High cholesterol    Past Surgical History  Procedure Laterality Date  . Hip surgery      bilateral  . Abdominal hysterectomy    . Cesarean section     Allergies  Allergies  Allergen Reactions  . Codeine   . Morphine And Related   . Amlodipine Other (See Comments)   Chief complain: Hypertension  HPI  81 year old female who is very functional and just recently moved to assisted living, with prior medical history of hypertension and chronic persistent atrial fibrillation is coming for second complete opinion in regards to placement of a pacemaker. The patient hasn't had known atrial fibrillation for at least 10 years and has been anticoagulated warfarin. She occasionally get palpitations that are uncomfortable for her but are not associated with dizziness or shortness of breath. She is very active can walk about an hour a day and denies any falls. She is very compliant with her medications. Her blood pressure significantly elevated on today but she doesn't check it at home. She denies any lower extremity edema, claudications, orthopnea or paroxysmal nocturnal dyspnea she also denies chest pain.  06/22/2014 - the patient is coming after 2 months, she states that she feels very well, continues to walk with a walker and denies any falls, dizziness, presyncope or syncope. She is being compliant with her medicines. She underwent echocardiography that showed normal left ventricular ejection fraction but severe pulmonary hypertension. She was started on Imdur 30 mg daily.  Home  Medications  Prior to Admission medications   Medication Sig Start Date End Date Taking? Authorizing Provider  ALPRAZolam Duanne Moron) 0.25 MG tablet Take 1 tablet (0.25 mg total) by mouth at bedtime as needed for anxiety or sleep. 03/24/14  Yes Tanna Furry, MD  celecoxib (CELEBREX) 200 MG capsule Take 200 mg by mouth daily.    Yes Historical Provider, MD  cloNIDine (CATAPRES) 0.2 MG tablet Take 0.2 mg by mouth daily.    Yes Historical Provider, MD  cloNIDine (CATAPRES) 0.2 MG tablet TAKE 1 TABLET BY MOUTH EVERY NIGHT AT BEDTIME 03/22/14  Yes Historical Provider, MD  lisinopril-hydrochlorothiazide (PRINZIDE,ZESTORETIC) 20-12.5 MG per tablet Take 2 tablets by mouth daily.    Yes Historical Provider, MD  Losartan Potassium (COZAAR PO) Take 20 mg by mouth daily.   Yes Historical Provider, MD  nebivolol (BYSTOLIC) 10 MG tablet TAKE 3 TABLETS BY MOUTH EVERY DAY 10/19/13  Yes Historical Provider, MD  potassium chloride SA (K-DUR,KLOR-CON) 20 MEQ tablet Take 20 mEq by mouth 2 (two) times daily.  03/28/14  Yes Historical Provider, MD  simvastatin (ZOCOR) 20 MG tablet TAKE 1 TABLET BY MOUTH EVERY DAY 01/10/14  Yes Historical Provider, MD  warfarin (COUMADIN) 1 MG tablet Take 5 mg by mouth as directed. mon - Sat 5mg , Sun 7.5mg    Yes Historical Provider, MD    Family History  Family History  Problem Relation Age of Onset  . Stroke Mother   . Heart attack Neg Hx   . Hypertension Brother     Social History  History   Social  History  . Marital Status: Married    Spouse Name: N/A  . Number of Children: N/A  . Years of Education: N/A   Occupational History  . Not on file.   Social History Main Topics  . Smoking status: Never Smoker   . Smokeless tobacco: Not on file  . Alcohol Use: No  . Drug Use: No  . Sexual Activity: Not on file   Other Topics Concern  . Not on file   Social History Narrative     Review of Systems, as per HPI, otherwise negative General:  No chills, fever, night sweats  or weight changes.  Cardiovascular:  No chest pain, dyspnea on exertion, edema, orthopnea, palpitations, paroxysmal nocturnal dyspnea. Dermatological: No rash, lesions/masses Respiratory: No cough, dyspnea Urologic: No hematuria, dysuria Abdominal:   No nausea, vomiting, diarrhea, bright red blood per rectum, melena, or hematemesis Neurologic:  No visual changes, wkns, changes in mental status. All other systems reviewed and are otherwise negative except as noted above.  Physical Exam  Blood pressure 172/78, pulse 74, height 5\' 4"  (1.626 m), weight 162 lb (73.483 kg).  General: Pleasant, NAD Psych: Normal affect. Neuro: Alert and oriented X 3. Moves all extremities spontaneously. HEENT: Normal  Neck: Supple without bruits or JVD. Lungs:  Resp regular and unlabored, CTA. Heart: Irregularly irregular, no s3, s4, or murmurs. Abdomen: Soft, non-tender, non-distended, BS + x 4.  Extremities: No clubbing, cyanosis or edema. DP/PT/Radials 2+ and equal bilaterally.  Labs:  No results for input(s): CKTOTAL, CKMB, TROPONINI in the last 72 hours. Lab Results  Component Value Date   WBC 6.7 03/24/2014   HGB 14.5 03/24/2014   HCT 43.5 03/24/2014   MCV 94.4 03/24/2014   PLT 107* 03/24/2014    No results found for: DDIMER Invalid input(s): POCBNP    Component Value Date/Time   NA 140 03/24/2014 0845   K 3.9 03/24/2014 0845   CL 100 03/24/2014 0845   CO2 23 03/24/2014 0845   GLUCOSE 120* 03/24/2014 0845   BUN 30* 03/24/2014 0845   CREATININE 1.00 03/24/2014 0845   CALCIUM 9.5 03/24/2014 0845   GFRNONAA 49* 03/24/2014 0845   GFRAA 57* 03/24/2014 0845   No results found for: CHOL, HDL, LDLCALC, TRIG  Accessory Clinical Findings  Echocardiogram - 05/11/2014 Study Conclusions  - Left ventricle: The cavity size was normal. Wall thickness was increased in a pattern of mild LVH. Systolic function was normal. The estimated ejection fraction was in the range of 55% to 60%. -  Aortic valve: There was mild to moderate regurgitation. - Mitral valve: There was mild to moderate regurgitation. - Left atrium: The atrium was severely dilated. - Right ventricle: The cavity size was mildly dilated. Systolic function was mildly reduced. - Right atrium: The atrium was severely dilated. - Tricuspid valve: There was mild-moderate regurgitation. - Pulmonary arteries: PA peak pressure: 62 mm Hg (S). - Pericardium, extracardiac: A trivial pericardial effusion was identified.  ECG - atrial fibrillation, 60 bpm, abnormal EKG.    Assessment & Plan  81 year old female  1. Chronic persistent atrial fibrillation, on chronic anticoagulation with warfarin no risk of falls at this point patient very active. She occasionally get palpitations but I assume is A. fib with RVR. I would prescribe short acting diltiazem 60 mg when necessary.  I don't see at this point any indication for placement of permanent pacemaker as she doesn't have any pauses, presyncope or syncopal episodes. The only possible reason would be a placement of biventricular  pacemaker however she has no symptoms of congestive heart failure.  I have personally reviewed her echocardiogram and it shows severely dilated left atrium meaning she has most probably been in atrial fibrillation for prolonged time. We'll continue rate control.  2. Hypertension, still uncontrolled, repeat blood pressure today 152/74 mmHg but is acceptable for her age. At the last visit we increased clonidine to 0.3 mg orally daily, we also added Imdur 30 mg daily, will continue carvedilol 25 mg twice a day, lisinopril hydrochlorothiazide and losartan.  3. Hyperlipidemia - continue simvastatin.  Follow up in 6 months.    Dorothy Spark, MD, [facility] 06/22/2014, 8:34 AM

## 2014-06-22 NOTE — Patient Instructions (Signed)
**Note De-identified [facility] Obfuscation** Your physician recommends that you continue on your current medications as directed. Please refer to the Current Medication list given to you today.  Your physician wants you to follow-up in: 6 months. You will receive a reminder letter in the mail two months in advance. If you don't receive a letter, please call our office to schedule the follow-up appointment.  

## 2014-09-13 ENCOUNTER — Telehealth: Payer: Self-pay | Admitting: Cardiology

## 2014-09-13 MED ORDER — CARVEDILOL 12.5 MG PO TABS: ORAL_TABLET | ORAL | Status: DC

## 2014-09-13 NOTE — Telephone Encounter (Signed)
Contacted the pt to inform her that per Dr Meda Coffee she wants her to decrease the morning dose of carvedilol to 12.5 mg and evening dose to 25 mg.  Informed the pt that I will send in carvedilol 12.5 mg tablets to her pharmacy so when she picks this up she will need to take 1 pill (12.5 mg) in the morning and take 2 tablets (25 mg) in the evening.  Confirmed the pharmacy of choice with the pt.  Pt verbalized understanding and agrees with this plan.

## 2014-09-13 NOTE — Telephone Encounter (Signed)
Lets try to decrease the morning dose to half = 12.5 mg and evening dose 25 mg

## 2014-09-13 NOTE — Telephone Encounter (Signed)
Pt calling to inform Dr Meda Coffee that when she takes her morning dose of carvedilol, within 20-30 minutes she feels weak, tired, and feels like she is going to faint.  Pt states when she takes her evening dose of carvedilol, she does not have any of the above mentioned complaints.  Pt states she stopped taking the morning dose, and has only been taking the evening dose with no complications.  Pt would like for Dr Meda Coffee to advise on if taking carvedilol once a day (at night) is recommended, or should she try a different regimen? Pt states she has responded well to this med, but only in the evenings.  Advised the pt to continue just taking her evening dose, and I will forward this message to Dr Meda Coffee for further review and recommendation and follow-up with the pt thereafter.  Pt verbalized understanding and agrees with this plan.

## 2014-09-13 NOTE — Telephone Encounter (Signed)
New message       Pt c/o medication issue:  1. Name of Medication: carvedilol 2. How are you currently taking this medication (dosage and times per day)? 25mg  bid 3. Are you having a reaction (difficulty breathing--STAT)? no  4. What is your medication issue? Pt states she "feels weird" 15-20 min after taking rx. Feels like she is going to faint  Can she drop the morning dose and just take it at night?

## 2014-10-08 ENCOUNTER — Emergency Department (HOSPITAL_BASED_OUTPATIENT_CLINIC_OR_DEPARTMENT_OTHER): Payer: Medicare Other

## 2014-10-08 ENCOUNTER — Emergency Department (HOSPITAL_BASED_OUTPATIENT_CLINIC_OR_DEPARTMENT_OTHER)
Admission: EM | Admit: 2014-10-08 | Discharge: 2014-10-08 | Disposition: A | Discharge status: Home / Self Care | Payer: Medicare Other | Attending: Emergency Medicine | Admitting: Emergency Medicine

## 2014-10-08 ENCOUNTER — Encounter (HOSPITAL_BASED_OUTPATIENT_CLINIC_OR_DEPARTMENT_OTHER): Payer: Self-pay | Admitting: *Deleted

## 2014-10-08 DIAGNOSIS — S50812A Abrasion of left forearm, initial encounter: Secondary | ICD-10-CM | POA: Insufficient documentation

## 2014-10-08 DIAGNOSIS — Y9389 Activity, other specified: Secondary | ICD-10-CM | POA: Diagnosis not present

## 2014-10-08 DIAGNOSIS — E780 Pure hypercholesterolemia: Secondary | ICD-10-CM | POA: Diagnosis not present

## 2014-10-08 DIAGNOSIS — I4891 Unspecified atrial fibrillation: Secondary | ICD-10-CM | POA: Insufficient documentation

## 2014-10-08 DIAGNOSIS — I10 Essential (primary) hypertension: Secondary | ICD-10-CM | POA: Insufficient documentation

## 2014-10-08 DIAGNOSIS — W01198A Fall on same level from slipping, tripping and stumbling with subsequent striking against other object, initial encounter: Secondary | ICD-10-CM | POA: Diagnosis not present

## 2014-10-08 DIAGNOSIS — T148 Other injury of unspecified body region: Secondary | ICD-10-CM | POA: Insufficient documentation

## 2014-10-08 DIAGNOSIS — Z8739 Personal history of other diseases of the musculoskeletal system and connective tissue: Secondary | ICD-10-CM | POA: Insufficient documentation

## 2014-10-08 DIAGNOSIS — Z7901 Long term (current) use of anticoagulants: Secondary | ICD-10-CM | POA: Insufficient documentation

## 2014-10-08 DIAGNOSIS — S298XXA Other specified injuries of thorax, initial encounter: Secondary | ICD-10-CM

## 2014-10-08 DIAGNOSIS — Y998 Other external cause status: Secondary | ICD-10-CM | POA: Diagnosis not present

## 2014-10-08 DIAGNOSIS — Z79899 Other long term (current) drug therapy: Secondary | ICD-10-CM | POA: Insufficient documentation

## 2014-10-08 DIAGNOSIS — Y92009 Unspecified place in unspecified non-institutional (private) residence as the place of occurrence of the external cause: Secondary | ICD-10-CM | POA: Insufficient documentation

## 2014-10-08 DIAGNOSIS — W19XXXA Unspecified fall, initial encounter: Secondary | ICD-10-CM

## 2014-10-08 DIAGNOSIS — T148XXA Other injury of unspecified body region, initial encounter: Secondary | ICD-10-CM

## 2014-10-08 DIAGNOSIS — S299XXA Unspecified injury of thorax, initial encounter: Secondary | ICD-10-CM | POA: Insufficient documentation

## 2014-10-08 MED ORDER — ACETAMINOPHEN 325 MG PO TABS
650.0000 mg | ORAL_TABLET | Freq: Once | ORAL | Status: AC
  Administered 2014-10-08: 650 mg via ORAL
  Filled 2014-10-08: qty 2

## 2014-10-08 NOTE — Discharge Instructions (Signed)
Blunt Chest Trauma °Blunt chest trauma is an injury caused by a blow to the chest. These chest injuries can be very painful. Blunt chest trauma often results in bruised or broken (fractured) ribs. Most cases of bruised and fractured ribs from blunt chest traumas get better after 1 to 3 weeks of rest and pain medicine. Often, the soft tissue in the chest wall is also injured, causing pain and bruising. Internal organs, such as the heart and lungs, may also be injured. Blunt chest trauma can lead to serious medical problems. This injury requires immediate medical care. °CAUSES  °· Motor vehicle collisions. °· Falls. °· Physical violence. °· Sports injuries. °SYMPTOMS  °· Chest pain. The pain may be worse when you move or breathe deeply. °· Shortness of breath. °· Lightheadedness. °· Bruising. °· Tenderness. °· Swelling. °DIAGNOSIS  °Your caregiver will do a physical exam. X-rays may be taken to look for fractures. However, minor rib fractures may not show up on X-rays until a few days after the injury. If a more serious injury is suspected, further imaging tests may be done. This may include ultrasounds, computed tomography (CT) scans, or magnetic resonance imaging (MRI). °TREATMENT  °Treatment depends on the severity of your injury. Your caregiver may prescribe pain medicines and deep breathing exercises. °HOME CARE INSTRUCTIONS °· Limit your activities until you can move around without much pain. °· Do not do any strenuous work until your injury is healed. °· Put ice on the injured area. °¨ Put ice in a plastic bag. °¨ Place a towel between your skin and the bag. °¨ Leave the ice on for 15-20 minutes, 03-04 times a day. °· You may wear a rib belt as directed by your caregiver to reduce pain. °· Practice deep breathing as directed by your caregiver to keep your lungs clear. °· Only take over-the-counter or prescription medicines for pain, fever, or discomfort as directed by your caregiver. °SEEK IMMEDIATE MEDICAL  CARE IF:  °· You have increasing pain or shortness of breath. °· You cough up blood. °· You have nausea, vomiting, or abdominal pain. °· You have a fever. °· You feel dizzy, weak, or you faint. °MAKE SURE YOU: °· Understand these instructions. °· Will watch your condition. °· Will get help right away if you are not doing well or get worse. °Document Released: 05/07/2004 Document Revised: 06/22/2011 Document Reviewed: 01/14/2011 °[employer]® Patient Information ©2015 [employer]. This information is not intended to replace advice given to you by your health care provider. Make sure you discuss any questions you have with your health care provider. ° ° °

## 2014-10-08 NOTE — ED Notes (Addendum)
Pt c/o fall from standing c/o left rib pain x 3 hrs ago Cataract surgery this am

## 2014-10-08 NOTE — ED Provider Notes (Signed)
CSN: [id number]     Arrival date & time 10/08/14  1027 History  This chart was scribed for  Ripley Fraise, MD by Altamease Oiler, ED Scribe. This patient was seen in room MH06/MH06 and the patient's care was started at 8:14 PM.   Chief Complaint  Patient presents with  . Fall     Patient is a 81 y.o. female presenting with fall. The history is provided by the patient and a relative. No language interpreter was used.  Fall This is a new problem. The current episode started 1 to 2 hours ago. The problem occurs constantly. The problem has not changed since onset.Pertinent negatives include no abdominal pain, no headaches and no shortness of breath. Nothing aggravates the symptoms. Nothing relieves the symptoms. She has tried nothing for the symptoms.   Tammy Vaughn is a 81 y.o. female with PMHx of a-fib, HTN, and arthritis who presents to the Emergency Department complaining of a mechanical fall this evening at home. She tripped and fell into a wall on her left side hitting a door knob. Associated symptoms include left arm pain, a skin tear on the left forearm, and left rib pain.  Pt denies head injury, LOC, neck pain, back pain, LE pain, hemoptysis, and SOB.  She had been off warfarin for 1 week because of a tooth extraction and cataract surgery but resumed use tonight.   Past Medical History  Diagnosis Date  . Arthritis   . Hypertension   . Hx of right knee surgery   . Atrial fib/flutter, transient   . High cholesterol    Past Surgical History  Procedure Laterality Date  . Hip surgery      bilateral  . Abdominal hysterectomy    . Cesarean section     Family History  Problem Relation Age of Onset  . Stroke Mother   . Heart attack Neg Hx   . Hypertension Brother    History  Substance Use Topics  . Smoking status: Never Smoker   . Smokeless tobacco: Not on file  . Alcohol Use: No   OB History    No data available     Review of Systems  Respiratory: Negative for apnea and  shortness of breath.   Gastrointestinal: Negative for abdominal pain.  Musculoskeletal: Negative for back pain and neck pain.       Left rib pain Left arm pain Left forearm skin tear  Neurological: Negative for syncope and headaches.  All other systems reviewed and are negative.     Allergies  Codeine; Morphine and related; and Amlodipine  Home Medications   Prior to Admission medications   Medication Sig Start Date End Date Taking? Authorizing Provider  ALPRAZolam Duanne Moron) 0.25 MG tablet Take 1 tablet (0.25 mg total) by mouth at bedtime as needed for anxiety or sleep. 03/24/14   Tanna Furry, MD  carvedilol (COREG) 12.5 MG tablet Take 12.5 mg (1 tablet) in the morning and take 25 mg (2 tablets) in the evening 09/13/14   Dorothy Spark, MD  celecoxib (CELEBREX) 200 MG capsule Take 200 mg by mouth daily.     Historical Provider, MD  cloNIDine (CATAPRES) 0.3 MG tablet Take 1 tablet (0.3 mg total) by mouth daily. 04/27/14   Dorothy Spark, MD  diltiazem (CARDIZEM) 60 MG tablet Take 1 tablet (60 mg total) by mouth as needed. For atrial fibrillation 04/27/14   Dorothy Spark, MD  isosorbide mononitrate (IMDUR) 30 MG 24 hr tablet Take 1 tablet (30  mg total) by mouth daily. 05/15/14   Dorothy Spark, MD  lisinopril-hydrochlorothiazide (PRINZIDE,ZESTORETIC) 20-12.5 MG per tablet Take 2 tablets by mouth daily.     Historical Provider, MD  Losartan Potassium (COZAAR PO) Take 20 mg by mouth daily.    Historical Provider, MD  potassium chloride SA (K-DUR,KLOR-CON) 20 MEQ tablet Take 20 mEq by mouth 2 (two) times daily.  03/28/14   Historical Provider, MD  simvastatin (ZOCOR) 20 MG tablet TAKE 1 TABLET BY MOUTH EVERY DAY 01/10/14   Historical Provider, MD  warfarin (COUMADIN) 1 MG tablet Take 5 mg by mouth as directed. mon - Sat 5mg , Sun 7.5mg     Historical Provider, MD   BP 159/83 mmHg  Pulse 74  Temp(Src) 97.9 F (36.6 C)  Resp 18  Ht 5\' 4"  (1.626 m)  Wt 160 lb (72.576 kg)  BMI 27.45  kg/m2  SpO2 97% Physical Exam CONSTITUTIONAL: Well developed/well nourished HEAD: Normocephalic/atraumatic ENMT: Mucous membranes moist NECK: supple no meningeal signs SPINE/BACK:entire spine nontender CV: S1/S2 noted, no murmurs/rubs/gallops noted LUNGS: Lungs are clear to auscultation bilaterally, no apparent distress Chest: Diffuse left sided chest wall tenderness, no bruising or crepitus ABDOMEN: soft, nontender, no rebound or guarding, bowel sounds noted throughout abdomen GU:no cva tenderness NEURO: Pt is awake/alert/appropriate, moves all extremitiesx4.  No facial droop.   EXTREMITIES: pulses normal/equal, full ROM, abrasion to left forearm, bruising to left humerus, no deformity noted, she is able to range to left shoulder/elbow/wrist without difficulty SKIN: warm, color normal PSYCH: no abnormalities of mood noted, alert and oriented to situation  ED Course  Procedures  DIAGNOSTIC STUDIES: Oxygen Saturation is 97% on RA, normal by my interpretation.    COORDINATION OF CARE: 8:16 PM Discussed treatment plan which includes CXR and pain management with pt at bedside and pt agreed to plan.  Imaging negative She just restarted coumadin so no need to check INR No signs of abd injury No signs of Head injury Stable for d/c home  Labs Review Labs Reviewed - No data to display  Imaging Review Dg Ribs Unilateral W/chest Left  10/08/2014   CLINICAL DATA:  Status post fall today with a blow to the left side on a door knob. Pain. Initial encounter.  EXAM: LEFT RIBS AND CHEST - 3+ VIEW  COMPARISON:  PA and lateral chest 03/24/2014.  FINDINGS: There is cardiomegaly without edema. No pneumothorax or pleural effusion. No rib fracture is identified.  IMPRESSION: No acute abnormality.  Cardiomegaly without edema.   Electronically Signed   By: Inge Rise M.D.   On: 10/08/2014 20:59     EKG Interpretation None      MDM   Final diagnoses:  Fall, initial encounter  Blunt chest  trauma, initial encounter  Contusion  Abrasion of left forearm, initial encounter     Nursing notes including past medical history and social history reviewed and considered in documentation xrays/imaging reviewed by myself and considered during evaluation   I personally performed the services described in this documentation, which was scribed in my presence. The recorded information has been reviewed and is accurate.    Ripley Fraise, MD 10/08/14 2114

## 2014-11-14 ENCOUNTER — Other Ambulatory Visit: Payer: Self-pay

## 2014-11-14 DIAGNOSIS — I4891 Unspecified atrial fibrillation: Secondary | ICD-10-CM

## 2014-11-14 DIAGNOSIS — R079 Chest pain, unspecified: Secondary | ICD-10-CM

## 2014-11-14 MED ORDER — CLONIDINE HCL 0.3 MG PO TABS: 0.3000 mg | ORAL_TABLET | Freq: Every day | ORAL | Status: DC

## 2014-11-27 DIAGNOSIS — N3941 Urge incontinence: Secondary | ICD-10-CM | POA: Insufficient documentation

## 2014-12-19 ENCOUNTER — Encounter: Payer: Self-pay | Admitting: *Deleted

## 2014-12-24 ENCOUNTER — Ambulatory Visit: Payer: Medicare Other | Admitting: Cardiology

## 2014-12-24 DIAGNOSIS — M546 Pain in thoracic spine: Secondary | ICD-10-CM | POA: Insufficient documentation

## 2014-12-26 ENCOUNTER — Ambulatory Visit: Payer: Medicare Other | Admitting: Cardiology

## 2014-12-29 ENCOUNTER — Encounter (HOSPITAL_BASED_OUTPATIENT_CLINIC_OR_DEPARTMENT_OTHER): Payer: Self-pay | Admitting: *Deleted

## 2014-12-29 ENCOUNTER — Emergency Department (HOSPITAL_BASED_OUTPATIENT_CLINIC_OR_DEPARTMENT_OTHER)
Admission: EM | Admit: 2014-12-29 | Discharge: 2014-12-29 | Disposition: A | Discharge status: Home / Self Care | Payer: Medicare Other | Attending: Emergency Medicine | Admitting: Emergency Medicine

## 2014-12-29 DIAGNOSIS — Z9889 Other specified postprocedural states: Secondary | ICD-10-CM | POA: Diagnosis not present

## 2014-12-29 DIAGNOSIS — M1990 Unspecified osteoarthritis, unspecified site: Secondary | ICD-10-CM | POA: Diagnosis not present

## 2014-12-29 DIAGNOSIS — I10 Essential (primary) hypertension: Secondary | ICD-10-CM | POA: Insufficient documentation

## 2014-12-29 DIAGNOSIS — M545 Low back pain, unspecified: Secondary | ICD-10-CM

## 2014-12-29 DIAGNOSIS — Z79899 Other long term (current) drug therapy: Secondary | ICD-10-CM | POA: Diagnosis not present

## 2014-12-29 DIAGNOSIS — E780 Pure hypercholesterolemia: Secondary | ICD-10-CM | POA: Insufficient documentation

## 2014-12-29 DIAGNOSIS — I4891 Unspecified atrial fibrillation: Secondary | ICD-10-CM | POA: Insufficient documentation

## 2014-12-29 LAB — URINALYSIS, ROUTINE W REFLEX MICROSCOPIC
Bilirubin Urine: NEGATIVE
Glucose, UA: NEGATIVE mg/dL
Ketones, ur: NEGATIVE mg/dL
LEUKOCYTES UA: NEGATIVE
NITRITE: NEGATIVE
PROTEIN: 100 mg/dL — AB
Specific Gravity, Urine: 1.012 (ref 1.005–1.030)
UROBILINOGEN UA: 2 mg/dL — AB (ref 0.0–1.0)
pH: 7.5 (ref 5.0–8.0)

## 2014-12-29 LAB — URINE MICROSCOPIC-ADD ON

## 2014-12-29 MED ORDER — ONDANSETRON 4 MG PO TBDP
4.0000 mg | ORAL_TABLET | Freq: Once | ORAL | Status: AC
Start: 2014-12-29 — End: 2014-12-29
  Administered 2014-12-29: 4 mg via ORAL
  Filled 2014-12-29: qty 1

## 2014-12-29 MED ORDER — ONDANSETRON 4 MG PO TBDP: 4.0000 mg | ORAL_TABLET | Freq: Three times a day (TID) | ORAL | Status: DC | PRN

## 2014-12-29 MED ORDER — HYDROCODONE-ACETAMINOPHEN 5-325 MG PO TABS: 1.0000 | ORAL_TABLET | Freq: Four times a day (QID) | ORAL | Status: DC | PRN

## 2014-12-29 MED ORDER — HYDROCODONE-ACETAMINOPHEN 5-325 MG PO TABS
1.0000 | ORAL_TABLET | Freq: Once | ORAL | Status: AC
  Administered 2014-12-29: 1 via ORAL
  Filled 2014-12-29: qty 1

## 2014-12-29 NOTE — ED Notes (Signed)
Having pain, at rt flank pain, has had a bladder infection, finished abx for that issue, having low grade fever this past week. Not sleeping well, also having having back spasms. Has frequency/ urgency

## 2014-12-29 NOTE — ED Notes (Signed)
Stopped taking coumadin a week ago because INR was high per son

## 2014-12-29 NOTE — ED Provider Notes (Addendum)
CSN: [id number]     Arrival date & time 12/29/14  0825 History   First MD Initiated Contact with Patient 12/29/14 [phone]     Chief Complaint  Patient presents with  . Back Pain     (Consider location/radiation/quality/duration/timing/severity/associated sxs/prior Treatment) Patient is a 81 y.o. female presenting with back pain. The history is provided by the patient.  Back Pain Associated symptoms: no abdominal pain, no chest pain, no dysuria, no fever, no numbness and no weakness    patient with a complaint of right-sided lumbar low back pain for about a week. Was started on prednisone on Monday and a muscle relaxer. No real improvement. Also had x-rays at that time which were reported to show no significant abnormality. No evidence of compression fracture. The pain does not radiate into the buttocks or the right leg. There is no numbness or weakness to the right foot. Patient states pain currently is 2-3/10. The pain is not getting any better. Patient has a listing of allergies to codeine and morphine and related drugs but in conversation with her son it sounds as if the problem is nausea and vomiting and not a true allergy.  Past Medical History  Diagnosis Date  . Arthritis   . Hypertension   . Hx of right knee surgery   . Atrial fib/flutter, transient   . High cholesterol    Past Surgical History  Procedure Laterality Date  . Hip surgery Bilateral   . Abdominal hysterectomy    . Cesarean section    . Total knee arthroplasty Right 06/2011   Family History  Problem Relation Age of Onset  . Stroke Mother   . Heart attack Neg Hx   . Hypertension Brother    Social History  Substance Use Topics  . Smoking status: Never Smoker   . Smokeless tobacco: None  . Alcohol Use: No   OB History    No data available     Review of Systems  Constitutional: Negative for fever.  HENT: Negative for congestion.   Eyes: Negative for visual disturbance.  Respiratory: Negative for shortness  of breath.   Cardiovascular: Negative for chest pain.  Gastrointestinal: Negative for abdominal pain.  Genitourinary: Negative for dysuria and difficulty urinating.  Musculoskeletal: Positive for back pain. Negative for neck pain.  Skin: Negative for rash.  Neurological: Negative for weakness and numbness.  Hematological: Does not bruise/bleed easily.  Psychiatric/Behavioral: Negative for confusion.      Allergies  Codeine; Morphine and related; and Amlodipine  Home Medications   Prior to Admission medications   Medication Sig Start Date End Date Taking? Authorizing Provider  ALPRAZolam Duanne Moron) 0.25 MG tablet Take 1 tablet (0.25 mg total) by mouth at bedtime as needed for anxiety or sleep. 03/24/14   Tanna Furry, MD  carvedilol (COREG) 12.5 MG tablet Take 12.5 mg (1 tablet) in the morning and take 25 mg (2 tablets) in the evening 09/13/14   Dorothy Spark, MD  celecoxib (CELEBREX) 200 MG capsule Take 200 mg by mouth daily.     Historical Provider, MD  cloNIDine (CATAPRES) 0.3 MG tablet Take 1 tablet (0.3 mg total) by mouth daily. 11/14/14   Dorothy Spark, MD  diltiazem (CARDIZEM) 60 MG tablet Take 1 tablet (60 mg total) by mouth as needed. For atrial fibrillation 04/27/14   Dorothy Spark, MD  HYDROcodone-acetaminophen (NORCO/VICODIN) 5-325 MG per tablet Take 1 tablet by mouth every 6 (six) hours as needed for moderate pain. 12/29/14   Scott  Rogene Houston, MD  isosorbide mononitrate (IMDUR) 30 MG 24 hr tablet Take 1 tablet (30 mg total) by mouth daily. 05/15/14   Dorothy Spark, MD  lisinopril-hydrochlorothiazide (PRINZIDE,ZESTORETIC) 20-12.5 MG per tablet Take 2 tablets by mouth daily.     Historical Provider, MD  Losartan Potassium (COZAAR PO) Take 20 mg by mouth daily.    Historical Provider, MD  ondansetron (ZOFRAN ODT) 4 MG disintegrating tablet Take 1 tablet (4 mg total) by mouth every 8 (eight) hours as needed for nausea or vomiting. 12/29/14   Fredia Sorrow, MD  potassium  chloride SA (K-DUR,KLOR-CON) 20 MEQ tablet Take 20 mEq by mouth 2 (two) times daily.  03/28/14   Historical Provider, MD  simvastatin (ZOCOR) 20 MG tablet TAKE 1 TABLET BY MOUTH EVERY DAY 01/10/14   Historical Provider, MD  warfarin (COUMADIN) 1 MG tablet Take 5 mg by mouth as directed. mon - Sat 5mg , Sun 7.5mg     Historical Provider, MD   BP 205/99 mmHg  Pulse 86  Temp(Src) 97.8 F (36.6 C) (Oral)  Resp 18  Ht 5\' 5"  (1.651 m)  Wt 162 lb (73.483 kg)  BMI 26.96 kg/m2  SpO2 95% Physical Exam  Constitutional: She is oriented to person, place, and time. She appears well-developed and well-nourished. No distress.  HENT:  Head: Normocephalic and atraumatic.  Mouth/Throat: Oropharynx is clear and moist.  Eyes: Conjunctivae and EOM are normal. Pupils are equal, round, and reactive to light.  Neck: Normal range of motion.  Cardiovascular: Normal rate, regular rhythm and normal heart sounds.   No murmur heard. Pulmonary/Chest: Effort normal and breath sounds normal. No respiratory distress.  Abdominal: Soft. Bowel sounds are normal. There is no tenderness.  Musculoskeletal: Normal range of motion. She exhibits no tenderness.  No tenderness to palpation of the lumbar back. Patient with good leg raise on both sides. No pain. No motor or sensory focal deficit.  Neurological: She is alert and oriented to person, place, and time. No cranial nerve deficit. She exhibits normal muscle tone. Coordination normal.  Skin: Skin is warm. No rash noted.  Nursing note and vitals reviewed.   ED Course  Procedures (including critical care time) Labs Review Labs Reviewed  URINALYSIS, ROUTINE W REFLEX MICROSCOPIC (NOT AT [facility]) - Abnormal; Notable for the following:    APPearance CLOUDY (*)    Hgb urine dipstick SMALL (*)    Protein, ur 100 (*)    Urobilinogen, UA 2.0 (*)    All other components within normal limits  URINE MICROSCOPIC-ADD ON - Abnormal; Notable for the following:    Bacteria, UA MANY (*)     All other components within normal limits  URINE CULTURE   Results for orders placed or performed during the hospital encounter of 12/29/14  Urinalysis, Routine w reflex microscopic (not at [facility])  Result Value Ref Range   Color, Urine YELLOW YELLOW   APPearance CLOUDY (A) CLEAR   Specific Gravity, Urine 1.012 1.005 - 1.030   pH 7.5 5.0 - 8.0   Glucose, UA NEGATIVE NEGATIVE mg/dL   Hgb urine dipstick SMALL (A) NEGATIVE   Bilirubin Urine NEGATIVE NEGATIVE   Ketones, ur NEGATIVE NEGATIVE mg/dL   Protein, ur 100 (A) NEGATIVE mg/dL   Urobilinogen, UA 2.0 (H) 0.0 - 1.0 mg/dL   Nitrite NEGATIVE NEGATIVE   Leukocytes, UA NEGATIVE NEGATIVE  Urine microscopic-add on  Result Value Ref Range   Squamous Epithelial / LPF RARE RARE   WBC, UA 0-2 <3 WBC/hpf   RBC /  HPF 0-2 <3 RBC/hpf   Bacteria, UA MANY (A) RARE   Urine-Other AMORPHOUS URATES/PHOSPHATES     Imaging Review No results found. I have personally reviewed and evaluated these images and lab results as part of my medical decision-making.   EKG Interpretation None      MDM   Final diagnoses:  Right-sided low back pain without sciatica    Patient had x-rays done recently of the lumbar back without any significant abnormalities other than osteopenia. They are available 100 care everywhere and it was reviewed. Patient without any evidence of sciatica. Patient tolerated hydrocodone here orally and will be continued on that. No signs of any allergic reaction. According to the patient's son it sounds more like that the reaction she has to pain medicines is nausea. Patient's back pain has no neuro focal deficits. Pain is actually right lumbar paraspinous muscle area and not midline.  They were concerned about her recurrent urinary tract infection. Urinalysis here shows no evidence of urinary tract infection however culture has been sent. Patient will be notified if it shows evidence of a urinary tract infection.  Patient on Coumadin  for atrial fibrillation medication was stopped recently because the level was too high. Restarting that'll be based on her regular doctors.  Fredia Sorrow, MD 12/29/14 [place], MD 12/29/14 1051

## 2014-12-29 NOTE — ED Notes (Signed)
MD at bedside. 

## 2014-12-29 NOTE — Discharge Instructions (Signed)
Continue current medications. Take the hydrocodone as needed for pain. Recommend only one tablet every 6 hours. Take the Zofran for any nausea. Make an appointment to follow-up with your doctor. The urinalysis that we did is not consistent with urinary tract infection however it was sent for urine culture.

## 2014-12-29 NOTE — ED Notes (Signed)
approx 1 week ago, began having rt flank pain, went to clinic received injection of prednisone and PO muscle relaxant, very little relief has been noted

## 2015-01-01 LAB — URINE CULTURE

## 2015-01-03 DIAGNOSIS — S22001A Stable burst fracture of unspecified thoracic vertebra, initial encounter for closed fracture: Secondary | ICD-10-CM | POA: Insufficient documentation

## 2015-01-24 ENCOUNTER — Other Ambulatory Visit: Payer: Self-pay | Admitting: Sports Medicine

## 2015-01-24 DIAGNOSIS — IMO0002 Reserved for concepts with insufficient information to code with codable children: Secondary | ICD-10-CM

## 2015-01-29 ENCOUNTER — Ambulatory Visit
Admission: RE | Admit: 2015-01-29 | Discharge: 2015-01-29 | Disposition: A | Discharge status: Home / Self Care | Payer: Medicare Other | Source: Ambulatory Visit | Attending: Sports Medicine | Admitting: Sports Medicine

## 2015-01-29 DIAGNOSIS — IMO0002 Reserved for concepts with insufficient information to code with codable children: Secondary | ICD-10-CM

## 2015-01-29 NOTE — Consult Note (Signed)
Chief Complaint:  I have a spine fracture.    Referring Physician(s): Dr. Gigi Gin  History of Present Illness: Tammy Vaughn is a 81 y.o. female presenting to the Vascular & Interventional Radiology clinic today for evaluation of her recent T10 fracture and possible vertebral augmentation, kindly referred by the office of Dr. Arrie Eastern.    Tammy Vaughn was interviewed today with her son. She reports not remembering when her injury occurred, but she is estimating approximately 6 weeks prior to today's date. An MRI that was performed 01/02/2015 demonstrates a T10 compression fracture with acute edema and a fracture plane at the inferior vertebral body. No significant retropulsion of fractured fragments at this site. She has additional chronic vertebral compression fractures of her mid thoracic spine.  Regarding her pain, she reports that it has been improving, and currently she does not have pain at all times. At its worst, she states that it is a 4/10 in intensity in her mid back, and she describes it as "a bite" when it occurs.  After the fracture occurred, she has been transferred to a rehabilitation facility from her usual assisted living facility. Currently she is participating in rehabilitation, and is not restricted in her activities. She feels that she is improving day today, with the use of the brace that has been prescribed.  Before the fracture, she was able to complete all of her tasks of daily living, and is quite active at her assisted living facility. She denies any associated neurologic problems after the injury, with no upper or lower extremity weakness. She has not had any urinary symptoms. No difficulty voiding.  At this time she has been decreasing the usage of medicines, although because her medicines are distributed to her daily by medical staff, she is uncertain of how many pain medicine she is taking. In the beginning, she reports Vicodin being difficult to  stomach, with associated nausea. Since then they have reduced the need for pain medications, and she reports that she frequently uses only Tylenol.     Past Medical History  Diagnosis Date  . Arthritis   . Hypertension   . Hx of right knee surgery   . Atrial fib/flutter, transient   . High cholesterol     Past Surgical History  Procedure Laterality Date  . Hip surgery Bilateral   . Abdominal hysterectomy    . Cesarean section    . Total knee arthroplasty Right 06/2011    Allergies: Codeine; Morphine and related; and Amlodipine  Medications: Prior to Admission medications   Medication Sig Start Date End Date Taking? Authorizing Provider  acetaminophen (TYLENOL) 325 MG tablet Take 650 mg by mouth every 6 (six) hours as needed.   Yes Historical Provider, MD  ALPRAZolam Duanne Moron) 0.25 MG tablet Take 1 tablet (0.25 mg total) by mouth at bedtime as needed for anxiety or sleep. 03/24/14  Yes Tanna Furry, MD  carvedilol (COREG) 12.5 MG tablet Take 12.5 mg (1 tablet) in the morning and take 25 mg (2 tablets) in the evening 09/13/14  Yes Dorothy Spark, MD  cloNIDine (CATAPRES) 0.3 MG tablet Take 1 tablet (0.3 mg total) by mouth daily. 11/14/14  Yes Dorothy Spark, MD  diltiazem (CARDIZEM) 60 MG tablet Take 1 tablet (60 mg total) by mouth as needed. For atrial fibrillation 04/27/14  Yes Dorothy Spark, MD  furosemide (LASIX) 20 MG tablet Take 20 mg by mouth daily.   Yes Historical Provider, MD  isosorbide mononitrate (IMDUR) 30  MG 24 hr tablet Take 1 tablet (30 mg total) by mouth daily. 05/15/14  Yes Dorothy Spark, MD  lactobacillus acidophilus (BACID) TABS tablet Take 2 tablets by mouth 3 (three) times daily.   Yes Historical Provider, MD  lisinopril-hydrochlorothiazide (PRINZIDE,ZESTORETIC) 20-12.5 MG per tablet Take 2 tablets by mouth daily.    Yes Historical Provider, MD  Losartan Potassium (COZAAR PO) Take 20 mg by mouth daily.   Yes Historical Provider, MD  mirtazapine (REMERON)  7.5 MG tablet Take 7.5 mg by mouth at bedtime.   Yes Historical Provider, MD  ondansetron (ZOFRAN ODT) 4 MG disintegrating tablet Take 1 tablet (4 mg total) by mouth every 8 (eight) hours as needed for nausea or vomiting. 12/29/14  Yes Fredia Sorrow, MD  polyethylene glycol (MIRALAX / GLYCOLAX) packet Take 17 g by mouth daily.   Yes Historical Provider, MD  potassium chloride SA (K-DUR,KLOR-CON) 20 MEQ tablet Take 20 mEq by mouth 2 (two) times daily.  03/28/14  Yes Historical Provider, MD  sennosides-docusate sodium (SENOKOT-S) 8.6-50 MG tablet Take 1 tablet by mouth daily.   Yes Historical Provider, MD  simvastatin (ZOCOR) 20 MG tablet TAKE 1 TABLET BY MOUTH EVERY DAY 01/10/14  Yes Historical Provider, MD  traMADol (ULTRAM) 50 MG tablet Take by mouth every 6 (six) hours as needed.   Yes Historical Provider, MD  Vitamin D, Ergocalciferol, (DRISDOL) 50000 UNITS CAPS capsule Take 50,000 Units by mouth every 7 (seven) days.   Yes Historical Provider, MD  celecoxib (CELEBREX) 200 MG capsule Take 200 mg by mouth daily.     Historical Provider, MD  HYDROcodone-acetaminophen (NORCO/VICODIN) 5-325 MG per tablet Take 1 tablet by mouth every 6 (six) hours as needed for moderate pain. 12/29/14   Fredia Sorrow, MD  warfarin (COUMADIN) 1 MG tablet Take 4 mg by mouth as directed. mon - Sat 5mg , Sun 7.5mg     Historical Provider, MD     Family History  Problem Relation Age of Onset  . Stroke Mother   . Heart attack Neg Hx   . Hypertension Brother     Social History   Social History  . Marital Status: Married    Spouse Name: N/A  . Number of Children: N/A  . Years of Education: N/A   Social History Main Topics  . Smoking status: Never Smoker   . Smokeless tobacco: Not on file  . Alcohol Use: No  . Drug Use: No  . Sexual Activity: Not on file   Other Topics Concern  . Not on file   Social History Narrative     Review of Systems: A 12 point ROS discussed and pertinent positives are indicated  in the HPI above.  All other systems are negative.  Review of Systems  Vital Signs: BP 134/89 mmHg  Pulse 75  Temp(Src) 98 F (36.7 C) (Oral)  Resp 14  Ht 5\' 5"  (1.651 m)  SpO2 96%  Physical Exam  Atraumatic normocephalic. Mucous membranes moist and pink.  Lateral deviation of the right eye. No scleral icterus. No scleral injection. Alert and oriented to person place and time. Normal affect. Good insight with questions and insight to her condition. Kyphotic curvature of the spine. Symmetric movement of the left and right chest on inspiration and expiration. No labored breathing. Moving all 4 extremities equally and grossly. No gross motor deficits. No gross sensory deficit.   Mallampati Score:  2 Imaging:  MRI of the thoracic and lumbar spine performed 01/02/2015 demonstrates T10 compression fracture without  significant height loss. Acute edema is present at the inferior fracture plain. Additional compression fractures of T8 and T9. No lumbar fracture.  Labs:  CBC:  Recent Labs  03/24/14 0845  WBC 6.7  HGB 14.5  HCT 43.5  PLT 107*    COAGS:  Recent Labs  03/24/14 0845  INR 2.77*    BMP:  Recent Labs  03/24/14 0845  NA 140  K 3.9  CL 100  CO2 23  GLUCOSE 120*  BUN 30*  CALCIUM 9.5  CREATININE 1.00  GFRNONAA 49*  GFRAA 57*    LIVER FUNCTION TESTS: No results for input(s): BILITOT, AST, ALT, ALKPHOS, PROT, ALBUMIN in the last 8760 hours.  TUMOR MARKERS: No results for input(s): AFPTM, CEA, CA199, CHROMGRNA in the last 8760 hours.  Assessment and Plan:  Ms Cisar is a pleasant 81 year old female presenting for evaluation for possible vertebral augmentation of a recent T10 fracture. Estimated time of the fracture is approximate 6 weeks before this date.  I had a long discussion with Ms Mcneff and her son regarding the pathology, pathophysiology, anatomy, treatment options, and goals of treatment regarding vertebral augmentation and  compression fracture. Specifically, I discussed the major goals of treatment to be reduction of pain,  return to function, and decreased pain medication usage. With these goals in mind, we establish a risk benefit ratio for each patient. Given that her pain is only 4/10 in intensity, and she does not feel that she is restricted at this point by her episodic discomfort, my impression is that the risk benefit ratio is not in favor of treatment at this time. Our literature regarding treatment at lower intensity levels of pain suggests decreased efficacy of the procedure, with no decrease in risk profile.  I discussed with her conservative treatment, with always the possibility of a future treatment if things progress or worsen. She is in favor of no procedure at this time, and is happy to proceed with a conservative plan of care. Her son agrees. I answered all of their questions to the best of my ability.  They will call us if things change, or if she needs reassessment for a possible treatment.  Thank you for this interesting consult.  I greatly enjoyed meeting [employer] and look forward to participating in their care.  A copy of this report was sent to the requesting provider on this date.  SignedCorrie Mckusick 01/29/2015, 11:07 AM   I spent a total of  40 Minutes   in face to face in clinical consultation, greater than 50% of which was counseling/coordinating care for T10 compression fracture and possible vertebral augmentation.

## 2015-02-13 DIAGNOSIS — K5901 Slow transit constipation: Secondary | ICD-10-CM | POA: Insufficient documentation

## 2015-03-13 ENCOUNTER — Emergency Department (HOSPITAL_BASED_OUTPATIENT_CLINIC_OR_DEPARTMENT_OTHER): Payer: Medicare Other

## 2015-03-13 ENCOUNTER — Encounter (HOSPITAL_BASED_OUTPATIENT_CLINIC_OR_DEPARTMENT_OTHER): Payer: Self-pay | Admitting: *Deleted

## 2015-03-13 ENCOUNTER — Inpatient Hospital Stay (HOSPITAL_BASED_OUTPATIENT_CLINIC_OR_DEPARTMENT_OTHER)
Admission: EM | Admit: 2015-03-13 | Discharge: 2015-03-16 | DRG: 291 | Disposition: A | Discharge status: Home / Self Care | Payer: Medicare Other | Attending: Family Medicine | Admitting: Family Medicine

## 2015-03-13 DIAGNOSIS — I5033 Acute on chronic diastolic (congestive) heart failure: Secondary | ICD-10-CM | POA: Insufficient documentation

## 2015-03-13 DIAGNOSIS — I272 Other secondary pulmonary hypertension: Secondary | ICD-10-CM | POA: Diagnosis present

## 2015-03-13 DIAGNOSIS — I959 Hypotension, unspecified: Secondary | ICD-10-CM | POA: Diagnosis not present

## 2015-03-13 DIAGNOSIS — E785 Hyperlipidemia, unspecified: Secondary | ICD-10-CM | POA: Diagnosis not present

## 2015-03-13 DIAGNOSIS — J810 Acute pulmonary edema: Secondary | ICD-10-CM | POA: Diagnosis not present

## 2015-03-13 DIAGNOSIS — F329 Major depressive disorder, single episode, unspecified: Secondary | ICD-10-CM | POA: Diagnosis present

## 2015-03-13 DIAGNOSIS — Z96651 Presence of right artificial knee joint: Secondary | ICD-10-CM | POA: Diagnosis present

## 2015-03-13 DIAGNOSIS — N183 Chronic kidney disease, stage 3 unspecified: Secondary | ICD-10-CM | POA: Diagnosis present

## 2015-03-13 DIAGNOSIS — G894 Chronic pain syndrome: Secondary | ICD-10-CM | POA: Diagnosis present

## 2015-03-13 DIAGNOSIS — R0602 Shortness of breath: Secondary | ICD-10-CM | POA: Diagnosis present

## 2015-03-13 DIAGNOSIS — M1990 Unspecified osteoarthritis, unspecified site: Secondary | ICD-10-CM | POA: Diagnosis present

## 2015-03-13 DIAGNOSIS — I5031 Acute diastolic (congestive) heart failure: Secondary | ICD-10-CM | POA: Diagnosis not present

## 2015-03-13 DIAGNOSIS — Z7901 Long term (current) use of anticoagulants: Secondary | ICD-10-CM

## 2015-03-13 DIAGNOSIS — I130 Hypertensive heart and chronic kidney disease with heart failure and stage 1 through stage 4 chronic kidney disease, or unspecified chronic kidney disease: Principal | ICD-10-CM | POA: Diagnosis present

## 2015-03-13 DIAGNOSIS — I4891 Unspecified atrial fibrillation: Secondary | ICD-10-CM | POA: Diagnosis present

## 2015-03-13 DIAGNOSIS — I160 Hypertensive urgency: Secondary | ICD-10-CM | POA: Diagnosis present

## 2015-03-13 DIAGNOSIS — I509 Heart failure, unspecified: Secondary | ICD-10-CM | POA: Diagnosis not present

## 2015-03-13 DIAGNOSIS — E876 Hypokalemia: Secondary | ICD-10-CM | POA: Diagnosis not present

## 2015-03-13 DIAGNOSIS — Z79899 Other long term (current) drug therapy: Secondary | ICD-10-CM | POA: Diagnosis not present

## 2015-03-13 DIAGNOSIS — E782 Mixed hyperlipidemia: Secondary | ICD-10-CM | POA: Diagnosis present

## 2015-03-13 DIAGNOSIS — I481 Persistent atrial fibrillation: Secondary | ICD-10-CM | POA: Diagnosis present

## 2015-03-13 DIAGNOSIS — I10 Essential (primary) hypertension: Secondary | ICD-10-CM | POA: Diagnosis not present

## 2015-03-13 DIAGNOSIS — E7800 Pure hypercholesterolemia, unspecified: Secondary | ICD-10-CM | POA: Diagnosis present

## 2015-03-13 HISTORY — DX: Stable burst fracture of unspecified thoracic vertebra, initial encounter for closed fracture: S22.001A

## 2015-03-13 HISTORY — DX: Pulmonary hypertension, unspecified: I27.20

## 2015-03-13 HISTORY — DX: Essential (primary) hypertension: I10

## 2015-03-13 HISTORY — DX: Other persistent atrial fibrillation: I48.19

## 2015-03-13 HISTORY — DX: Iron deficiency: E61.1

## 2015-03-13 HISTORY — DX: Chronic kidney disease, stage 3 (moderate): N18.3

## 2015-03-13 HISTORY — DX: Unspecified osteoarthritis, unspecified site: M19.90

## 2015-03-13 HISTORY — DX: Chronic kidney disease, stage 3 unspecified: N18.30

## 2015-03-13 HISTORY — DX: Personal history of other malignant neoplasm of skin: Z85.828

## 2015-03-13 HISTORY — DX: Diverticulosis of large intestine without perforation or abscess without bleeding: K57.30

## 2015-03-13 LAB — CBC WITH DIFFERENTIAL/PLATELET
BASOS PCT: 0 %
Basophils Absolute: 0 10*3/uL (ref 0.0–0.1)
EOS ABS: 0.1 10*3/uL (ref 0.0–0.7)
EOS PCT: 2 %
HCT: 38.4 % (ref 36.0–46.0)
HEMOGLOBIN: 12.5 g/dL (ref 12.0–15.0)
Lymphocytes Relative: 14 %
Lymphs Abs: 1 10*3/uL (ref 0.7–4.0)
MCH: 32.1 pg (ref 26.0–34.0)
MCHC: 32.6 g/dL (ref 30.0–36.0)
MCV: 98.5 fL (ref 78.0–100.0)
MONO ABS: 0.9 10*3/uL (ref 0.1–1.0)
MONOS PCT: 12 %
NEUTROS PCT: 72 %
Neutro Abs: 5.5 10*3/uL (ref 1.7–7.7)
PLATELETS: 147 10*3/uL — AB (ref 150–400)
RBC: 3.9 MIL/uL (ref 3.87–5.11)
RDW: 15.9 % — AB (ref 11.5–15.5)
WBC: 7.5 10*3/uL (ref 4.0–10.5)

## 2015-03-13 LAB — BASIC METABOLIC PANEL
Anion gap: 6 (ref 5–15)
BUN: 22 mg/dL — ABNORMAL HIGH (ref 6–20)
CALCIUM: 9.2 mg/dL (ref 8.9–10.3)
CO2: 28 mmol/L (ref 22–32)
CREATININE: 0.92 mg/dL (ref 0.44–1.00)
Chloride: 106 mmol/L (ref 101–111)
GFR calc non Af Amer: 54 mL/min — ABNORMAL LOW (ref >60)
Glucose, Bld: 146 mg/dL — ABNORMAL HIGH (ref 65–99)
Potassium: 3.5 mmol/L (ref 3.5–5.1)
Sodium: 140 mmol/L (ref 135–145)

## 2015-03-13 LAB — PROTIME-INR
INR: 1.7 — ABNORMAL HIGH (ref 0.00–1.49)
Prothrombin Time: 20 seconds — ABNORMAL HIGH (ref 11.6–15.2)

## 2015-03-13 LAB — TROPONIN I

## 2015-03-13 LAB — MAGNESIUM: Magnesium: 2.1 mg/dL (ref 1.7–2.4)

## 2015-03-13 LAB — BRAIN NATRIURETIC PEPTIDE: B NATRIURETIC PEPTIDE 5: 466.6 pg/mL — AB (ref 0.0–100.0)

## 2015-03-13 MED ORDER — LISINOPRIL 40 MG PO TABS
40.0000 mg | ORAL_TABLET | Freq: Every day | ORAL | Status: DC
  Administered 2015-03-13 – 2015-03-14 (×2): 40 mg via ORAL
  Filled 2015-03-13 (×2): qty 1

## 2015-03-13 MED ORDER — SIMVASTATIN 20 MG PO TABS
20.0000 mg | ORAL_TABLET | Freq: Every day | ORAL | Status: DC
  Administered 2015-03-13 – 2015-03-15 (×3): 20 mg via ORAL
  Filled 2015-03-13 (×3): qty 1

## 2015-03-13 MED ORDER — SODIUM CHLORIDE 0.9 % IV SOLN: 250.0000 mL | INTRAVENOUS | Status: DC | PRN

## 2015-03-13 MED ORDER — HYDROCODONE-ACETAMINOPHEN 5-325 MG PO TABS: 1.0000 | ORAL_TABLET | Freq: Four times a day (QID) | ORAL | Status: DC | PRN

## 2015-03-13 MED ORDER — CELECOXIB 200 MG PO CAPS: 200.0000 mg | ORAL_CAPSULE | Freq: Every day | ORAL | Status: DC

## 2015-03-13 MED ORDER — FUROSEMIDE 10 MG/ML IJ SOLN
40.0000 mg | Freq: Once | INTRAMUSCULAR | Status: AC
  Administered 2015-03-13: 40 mg via INTRAVENOUS
  Filled 2015-03-13: qty 4

## 2015-03-13 MED ORDER — ONDANSETRON HCL 4 MG PO TABS: 4.0000 mg | ORAL_TABLET | Freq: Four times a day (QID) | ORAL | Status: DC | PRN

## 2015-03-13 MED ORDER — SENNOSIDES-DOCUSATE SODIUM 8.6-50 MG PO TABS
1.0000 | ORAL_TABLET | Freq: Every day | ORAL | Status: DC
  Administered 2015-03-13 – 2015-03-16 (×4): 1 via ORAL
  Filled 2015-03-13 (×3): qty 1

## 2015-03-13 MED ORDER — HYDRALAZINE HCL 20 MG/ML IJ SOLN: 10.0000 mg | INTRAMUSCULAR | Status: DC | PRN

## 2015-03-13 MED ORDER — BACID PO TABS
2.0000 | ORAL_TABLET | Freq: Three times a day (TID) | ORAL | Status: DC
  Filled 2015-03-13: qty 2

## 2015-03-13 MED ORDER — ONDANSETRON HCL 4 MG/2ML IJ SOLN: 4.0000 mg | Freq: Four times a day (QID) | INTRAMUSCULAR | Status: DC | PRN

## 2015-03-13 MED ORDER — POTASSIUM CHLORIDE CRYS ER 10 MEQ PO TBCR
10.0000 meq | EXTENDED_RELEASE_TABLET | Freq: Every day | ORAL | Status: DC
  Administered 2015-03-13 – 2015-03-14 (×2): 10 meq via ORAL
  Filled 2015-03-13 (×2): qty 1

## 2015-03-13 MED ORDER — CARVEDILOL 12.5 MG PO TABS
12.5000 mg | ORAL_TABLET | Freq: Two times a day (BID) | ORAL | Status: DC
  Administered 2015-03-13 – 2015-03-16 (×6): 12.5 mg via ORAL
  Filled 2015-03-13 (×6): qty 1

## 2015-03-13 MED ORDER — CLONIDINE HCL 0.2 MG PO TABS
0.3000 mg | ORAL_TABLET | Freq: Every day | ORAL | Status: DC
  Administered 2015-03-13 – 2015-03-14 (×2): 0.3 mg via ORAL
  Filled 2015-03-13 (×2): qty 1

## 2015-03-13 MED ORDER — SODIUM CHLORIDE 0.9 % IJ SOLN: 3.0000 mL | INTRAMUSCULAR | Status: DC | PRN

## 2015-03-13 MED ORDER — TRAMADOL HCL 50 MG PO TABS: 50.0000 mg | ORAL_TABLET | Freq: Four times a day (QID) | ORAL | Status: DC | PRN

## 2015-03-13 MED ORDER — SODIUM CHLORIDE 0.9 % IJ SOLN
3.0000 mL | Freq: Two times a day (BID) | INTRAMUSCULAR | Status: DC
  Administered 2015-03-13 – 2015-03-16 (×6): 3 mL via INTRAVENOUS

## 2015-03-13 MED ORDER — MIRTAZAPINE 7.5 MG PO TABS
7.5000 mg | ORAL_TABLET | Freq: Every day | ORAL | Status: DC
  Administered 2015-03-13 – 2015-03-15 (×3): 7.5 mg via ORAL
  Filled 2015-03-13 (×4): qty 1

## 2015-03-13 MED ORDER — WARFARIN SODIUM 5 MG PO TABS
5.0000 mg | ORAL_TABLET | Freq: Once | ORAL | Status: AC
  Administered 2015-03-13: 5 mg via ORAL
  Filled 2015-03-13: qty 1

## 2015-03-13 MED ORDER — FUROSEMIDE 10 MG/ML IJ SOLN
40.0000 mg | Freq: Two times a day (BID) | INTRAMUSCULAR | Status: DC
  Administered 2015-03-14: 40 mg via INTRAVENOUS
  Filled 2015-03-13: qty 4

## 2015-03-13 MED ORDER — POLYETHYLENE GLYCOL 3350 17 G PO PACK
17.0000 g | PACK | Freq: Every day | ORAL | Status: DC
  Administered 2015-03-14 – 2015-03-16 (×3): 17 g via ORAL
  Filled 2015-03-13 (×3): qty 1

## 2015-03-13 MED ORDER — ISOSORBIDE MONONITRATE ER 30 MG PO TB24
30.0000 mg | ORAL_TABLET | Freq: Every day | ORAL | Status: DC
  Administered 2015-03-13 – 2015-03-16 (×4): 30 mg via ORAL
  Filled 2015-03-13 (×4): qty 1

## 2015-03-13 MED ORDER — ACETAMINOPHEN 325 MG PO TABS
650.0000 mg | ORAL_TABLET | Freq: Four times a day (QID) | ORAL | Status: DC | PRN
  Administered 2015-03-15: 650 mg via ORAL
  Filled 2015-03-13: qty 2

## 2015-03-13 MED ORDER — FUROSEMIDE 10 MG/ML IJ SOLN
20.0000 mg | Freq: Two times a day (BID) | INTRAMUSCULAR | Status: DC
  Administered 2015-03-13: 20 mg via INTRAVENOUS
  Filled 2015-03-13: qty 2

## 2015-03-13 MED ORDER — DILTIAZEM HCL 60 MG PO TABS: 60.0000 mg | ORAL_TABLET | ORAL | Status: DC | PRN

## 2015-03-13 MED ORDER — WARFARIN - PHARMACIST DOSING INPATIENT
Freq: Every day | Status: DC
  Administered 2015-03-14 – 2015-03-15 (×2)

## 2015-03-13 NOTE — H&P (Signed)
[facility] History and Physical  Tammy Vaughn [id number] DOB: [birth year] DOA: 03/13/2015  Referring physician: Dr Laneta Simmers [facility] ED PCP: Maylon Peppers, MD   Chief Complaint: SOB  HPI: Tammy Vaughn is a 81 y.o. female  SOB. Gradual onset 3 days ago. Constant. gettign worse. Nothing makes symptoms better. Associated w/ LE swelling that is constant and getting worse.. No home O2. Patient unsure she has gained or lost weight recently. Patient is unsure of her home medication regimen as she states that her son placed the medications out and she takes them. No recent medication changes.   Review of Systems:  Constitutional:  No weight loss, night sweats, Fevers, chills,  HEENT:  No headaches, Difficulty swallowing,Tooth/dental problems,Sore throat, Cardio-vascular:  No chest pain, Orthopnea, PND,  dizziness, palpitations  GI:  No heartburn, indigestion, abdominal pain, nausea, vomiting, diarrhea, change in bowel habits, loss of appetite  Resp: Per HPI Skin:  no rash or lesions.  GU:  no dysuria, change in color of urine, no urgency or frequency. No flank pain.  Musculoskeletal:   No joint pain or swelling. No decreased range of motion. No back pain.  Psych:  No change in mood or affect. No depression or anxiety. No memory loss.  Neuro:  No change in sensation, unilateral strength, or cognitive abilities  All other systems were reviewed and are negative.  Past Medical History  Diagnosis Date  . Arthritis   . Hypertension   . Hx of right knee surgery   . Atrial fib/flutter, transient   . High cholesterol   . Osteoarthritis   . Hyperlipemia   . Fracture     stable burst fracture t9-t10  . Renal disorder     hypertensive chronic kidney disease stage 3  . Diverticulosis of large intestine   . Iron deficiency   . Pulmonary hypertension [facility])    Past Surgical History  Procedure Laterality Date  . Hip surgery Bilateral   . Abdominal hysterectomy    . Cesarean  section    . Total knee arthroplasty Right 06/2011   Social History:  reports that she has never smoked. She does not have any smokeless tobacco history on file. She reports that she does not drink alcohol or use illicit drugs.  Allergies  Allergen Reactions  . Codeine   . Morphine And Related   . Amlodipine Other (See Comments)    Family History  Problem Relation Age of Onset  . Stroke Mother   . Heart attack Neg Hx   . Hypertension Brother      Prior to Admission medications   Medication Sig Start Date End Date Taking? Authorizing Provider  lisinopril (PRINIVIL,ZESTRIL) 40 MG tablet Take 40 mg by mouth daily.   Yes Historical Provider, MD  acetaminophen (TYLENOL) 325 MG tablet Take 650 mg by mouth every 6 (six) hours as needed.    Historical Provider, MD  ALPRAZolam Duanne Moron) 0.25 MG tablet Take 1 tablet (0.25 mg total) by mouth at bedtime as needed for anxiety or sleep. 03/24/14   Tanna Furry, MD  carvedilol (COREG) 12.5 MG tablet Take 12.5 mg (1 tablet) in the morning and take 25 mg (2 tablets) in the evening 09/13/14   Dorothy Spark, MD  celecoxib (CELEBREX) 200 MG capsule Take 200 mg by mouth daily.     Historical Provider, MD  cloNIDine (CATAPRES) 0.3 MG tablet Take 1 tablet (0.3 mg total) by mouth daily. 11/14/14   Dorothy Spark, MD  diltiazem (CARDIZEM) 60 MG  tablet Take 1 tablet (60 mg total) by mouth as needed. For atrial fibrillation 04/27/14   Dorothy Spark, MD  furosemide (LASIX) 20 MG tablet Take 20 mg by mouth daily.    Historical Provider, MD  HYDROcodone-acetaminophen (NORCO/VICODIN) 5-325 MG per tablet Take 1 tablet by mouth every 6 (six) hours as needed for moderate pain. 12/29/14   Fredia Sorrow, MD  isosorbide mononitrate (IMDUR) 30 MG 24 hr tablet Take 1 tablet (30 mg total) by mouth daily. 05/15/14   Dorothy Spark, MD  lactobacillus acidophilus (BACID) TABS tablet Take 2 tablets by mouth 3 (three) times daily.    Historical Provider, MD    lisinopril-hydrochlorothiazide (PRINZIDE,ZESTORETIC) 20-12.5 MG per tablet Take 2 tablets by mouth daily.     Historical Provider, MD  Losartan Potassium (COZAAR PO) Take 20 mg by mouth daily.    Historical Provider, MD  mirtazapine (REMERON) 7.5 MG tablet Take 7.5 mg by mouth at bedtime.    Historical Provider, MD  ondansetron (ZOFRAN ODT) 4 MG disintegrating tablet Take 1 tablet (4 mg total) by mouth every 8 (eight) hours as needed for nausea or vomiting. 12/29/14   Fredia Sorrow, MD  polyethylene glycol (MIRALAX / GLYCOLAX) packet Take 17 g by mouth daily.    Historical Provider, MD  potassium chloride SA (K-DUR,KLOR-CON) 20 MEQ tablet Take 10 mEq by mouth daily.  03/28/14   Historical Provider, MD  sennosides-docusate sodium (SENOKOT-S) 8.6-50 MG tablet Take 1 tablet by mouth daily.    Historical Provider, MD  simvastatin (ZOCOR) 20 MG tablet TAKE 1 TABLET BY MOUTH EVERY DAY 01/10/14   Historical Provider, MD  traMADol (ULTRAM) 50 MG tablet Take by mouth every 6 (six) hours as needed.    Historical Provider, MD  Vitamin D, Ergocalciferol, (DRISDOL) 50000 UNITS CAPS capsule Take 50,000 Units by mouth every 7 (seven) days.    Historical Provider, MD  warfarin (COUMADIN) 1 MG tablet Take 4 mg by mouth as directed. mon - Sat 5mg , Sun 7.5mg     Historical Provider, MD   Physical Exam: Filed Vitals:   03/13/15 1340 03/13/15 1400 03/13/15 1412 03/13/15 1522  BP: 196/100 179/94 174/94 181/138  Pulse: 77 73 74 85  Temp:    98.3 F (36.8 C)  TempSrc:    Oral  Resp: 18 18 18 18   Height:      Weight:      SpO2: 96% 94% 97% 94%    Wt Readings from Last 3 Encounters:  03/13/15 68.04 kg (150 lb)  12/29/14 73.483 kg (162 lb)  10/08/14 72.576 kg (160 lb)    General: Appears calm and comfortable Eyes:  PERRL, EOMI, normal lids, iris ENT:  grossly normal hearing, lips & tongue Neck:  no LAD, masses or thyromegaly Cardiovascular:  RRR, II/VI systolic murmur. 2-3+ LE edema Respiratory:  diminished breath sounds in bases w/ few crackles. nml effort.  Abdomen:  soft, ntnd Skin:  no rash or induration seen on limited exam Musculoskeletal:  grossly normal tone BUE/BLE Psychiatric:  grossly normal mood and affect, speech fluent and appropriate Neurologic:  CN 2-12 grossly intact, moves all extremities in coordinated fashion.          Labs on Admission:  Basic Metabolic Panel:  Recent Labs Lab 03/13/15 1105  NA 140  K 3.5  CL 106  CO2 28  GLUCOSE 146*  BUN 22*  CREATININE 0.92  CALCIUM 9.2  MG 2.1   Liver Function Tests: No results for input(s): AST, ALT, ALKPHOS,  BILITOT, PROT, ALBUMIN in the last 168 hours. No results for input(s): LIPASE, AMYLASE in the last 168 hours. No results for input(s): AMMONIA in the last 168 hours. CBC:  Recent Labs Lab 03/13/15 1105  WBC 7.5  NEUTROABS 5.5  HGB 12.5  HCT 38.4  MCV 98.5  PLT 147*   Cardiac Enzymes:  Recent Labs Lab 03/13/15 1105  TROPONINI <0.03    BNP (last 3 results)  Recent Labs  03/13/15 1105  BNP 466.6*    ProBNP (last 3 results)  Recent Labs  03/24/14 0845  PROBNP 3197.0*     CREATININE: 0.92 (03/13/15 1105) Estimated creatinine clearance - 39.6 mL/min  CBG: No results for input(s): GLUCAP in the last 168 hours.  Radiological Exams on Admission: Dg Chest 2 View  03/13/2015  CLINICAL DATA:  Cough, shortness of breath. EXAM: CHEST  2 VIEW COMPARISON:  October 08, 2014. FINDINGS: Stable cardiomegaly is noted. Mild central pulmonary vascular congestion is noted. No pneumothorax is noted. Old lower thoracic compression fracture is noted. Increased bibasilar opacities are noted concerning for subsegmental atelectasis or edema with minimal associated pleural effusions. IMPRESSION: Stable cardiomegaly with mild central pulmonary vascular congestion. Mild bibasilar opacities are noted concerning for subsegmental atelectasis or edema with minimal associated pleural effusions. Electronically  Signed   By: Marijo Conception, M.D.   On: 03/13/2015 11:33   Assessment/Plan Active Problems:   Acute on chronic diastolic (congestive) heart failure (HCC)   AoC CHF; EF 55% by last Echo. Unclear if pt taking home medications accurately. BNP 466 not overly impressive. CXR w/ small amount of congestion. No O2 Requirement. Pt not sure of dry wt.  - Tele - Observation - Lasix 20 IV BID (home 20 po Qday) - strict I/O, dly wts - continue home ACEi, and bblocker  Depression: - continue remeron  Chronic pain: - continue tramadol and norco, Celebrex  HLD: - continue statin  HTN: - continue home clonidine, coreg, lisinopril, imdur, dilt,  Afib: currently rate controlled and on coumadin. EKG showing Afib.  - continue coumadin - continue dilt, bblocker   Code Status: FULL  DVT Prophylaxis: Coumadin Family Communication: None Disposition Plan: Pending Improvement    Chrishawn Boley Lenna Sciara, MD Family Medicine [facility] [email or web address] Password [email or web address]

## 2015-03-13 NOTE — ED Notes (Signed)
MD at bedside. 

## 2015-03-13 NOTE — Progress Notes (Signed)
ANTICOAGULATION CONSULT NOTE - Follow Up Consult  Pharmacy Consult for warfarim Indication: atrial fibrillation  Patient Measurements: Height: 5\' 6"  (167.6 cm) Weight: 150 lb (68.04 kg) IBW/kg (Calculated) : 59.3  Vital Signs: Temp: 98.3 F (36.8 C) (11/30 1522) Temp Source: Oral (11/30 1522) BP: 181/138 mmHg (11/30 1522) Pulse Rate: 85 (11/30 1522)  Labs:  Recent Labs  03/13/15 1105  HGB 12.5  HCT 38.4  PLT 147*  LABPROT 20.0*  INR 1.70*  CREATININE 0.92  TROPONINI <0.03    Assessment: 89 yoF on warfarin PTA for afib admitted with dyspnea, LE swelling, likely diastolic HF.  PTA dose of warfarin was 4 mg daily, last dose on 11/29, admit INR 1.7 with stable CBC.  Goal of Therapy:  INR 2-3 Monitor platelets by anticoagulation protocol: Yes   Plan:  1. Warfarin 5 mg x 1 tonight 2. Daily INR 3. F/u cardiology plans and adjust anti-coag as needed   Vincenza Hews, PharmD, BCPS 03/13/2015, 5:53 PM Pager: [phone]

## 2015-03-13 NOTE — ED Notes (Signed)
[facility] at beside for transport.

## 2015-03-13 NOTE — ED Notes (Signed)
Patient c/o cough/shortness of breath since last week. No CP

## 2015-03-13 NOTE — ED Notes (Signed)
Report given to [facility] with [facility].

## 2015-03-13 NOTE — Progress Notes (Signed)
Received call from Dr. Laneta Simmers at [facility], to admit Tammy Vaughn secondary to SOB and LE edema; also with CXR demonstrating vascular congestion. BNP is elevated and she is mildly hypoxia at 89% on RA. Patient accepted for further treatment/evaluation of acute on chronic diastolic heart failure. Accepted to telemetry bed and inpatient status.  Barton Dubois [id number]

## 2015-03-13 NOTE — ED Provider Notes (Signed)
CSN: [id number]     Arrival date & time 03/13/15  [id number] History   First MD Initiated Contact with Patient 03/13/15 [id number]     Chief Complaint  Patient presents with  . Shortness of Breath     (Consider location/radiation/quality/duration/timing/severity/associated sxs/prior Treatment) Patient is a 81 y.o. female presenting with shortness of breath.  Shortness of Breath Severity:  Mild Onset quality:  Gradual Duration:  3 days Timing:  Constant Progression:  Unchanged Chronicity:  New Context: not weather changes   Context comment:  After thanksgiving Relieved by:  Nothing Worsened by:  Nothing tried Ineffective treatments:  None tried Associated symptoms: cough   Associated symptoms: no chest pain, no fever, no hemoptysis, no sputum production and no wheezing     Past Medical History  Diagnosis Date  . Arthritis   . Hypertension   . Hx of right knee surgery   . Atrial fib/flutter, transient   . High cholesterol   . Osteoarthritis   . Hyperlipemia   . Fracture     stable burst fracture t9-t10  . Renal disorder     hypertensive chronic kidney disease stage 3  . Diverticulosis of large intestine   . Iron deficiency   . Pulmonary hypertension [facility])    Past Surgical History  Procedure Laterality Date  . Hip surgery Bilateral   . Abdominal hysterectomy    . Cesarean section    . Total knee arthroplasty Right 06/2011   Family History  Problem Relation Age of Onset  . Stroke Mother   . Heart attack Neg Hx   . Hypertension Brother    Social History  Substance Use Topics  . Smoking status: Never Smoker   . Smokeless tobacco: None  . Alcohol Use: No   OB History    No data available     Review of Systems  Constitutional: Negative for fever.  Respiratory: Positive for cough and shortness of breath. Negative for hemoptysis, sputum production and wheezing.   Cardiovascular: Negative for chest pain.  All other systems reviewed and are negative.     Allergies   Codeine; Morphine and related; and Amlodipine  Home Medications   Prior to Admission medications   Medication Sig Start Date End Date Taking? Authorizing Provider  lisinopril (PRINIVIL,ZESTRIL) 40 MG tablet Take 40 mg by mouth daily.   Yes Historical Provider, MD  acetaminophen (TYLENOL) 325 MG tablet Take 650 mg by mouth every 6 (six) hours as needed.    Historical Provider, MD  ALPRAZolam Duanne Moron) 0.25 MG tablet Take 1 tablet (0.25 mg total) by mouth at bedtime as needed for anxiety or sleep. 03/24/14   Tanna Furry, MD  carvedilol (COREG) 12.5 MG tablet Take 12.5 mg (1 tablet) in the morning and take 25 mg (2 tablets) in the evening 09/13/14   Dorothy Spark, MD  celecoxib (CELEBREX) 200 MG capsule Take 200 mg by mouth daily.     Historical Provider, MD  cloNIDine (CATAPRES) 0.3 MG tablet Take 1 tablet (0.3 mg total) by mouth daily. 11/14/14   Dorothy Spark, MD  diltiazem (CARDIZEM) 60 MG tablet Take 1 tablet (60 mg total) by mouth as needed. For atrial fibrillation 04/27/14   Dorothy Spark, MD  furosemide (LASIX) 20 MG tablet Take 20 mg by mouth daily.    Historical Provider, MD  HYDROcodone-acetaminophen (NORCO/VICODIN) 5-325 MG per tablet Take 1 tablet by mouth every 6 (six) hours as needed for moderate pain. 12/29/14   Fredia Sorrow, MD  isosorbide  mononitrate (IMDUR) 30 MG 24 hr tablet Take 1 tablet (30 mg total) by mouth daily. 05/15/14   Dorothy Spark, MD  lactobacillus acidophilus (BACID) TABS tablet Take 2 tablets by mouth 3 (three) times daily.    Historical Provider, MD  lisinopril-hydrochlorothiazide (PRINZIDE,ZESTORETIC) 20-12.5 MG per tablet Take 2 tablets by mouth daily.     Historical Provider, MD  Losartan Potassium (COZAAR PO) Take 20 mg by mouth daily.    Historical Provider, MD  mirtazapine (REMERON) 7.5 MG tablet Take 7.5 mg by mouth at bedtime.    Historical Provider, MD  ondansetron (ZOFRAN ODT) 4 MG disintegrating tablet Take 1 tablet (4 mg total) by mouth every  8 (eight) hours as needed for nausea or vomiting. 12/29/14   Fredia Sorrow, MD  polyethylene glycol (MIRALAX / GLYCOLAX) packet Take 17 g by mouth daily.    Historical Provider, MD  potassium chloride SA (K-DUR,KLOR-CON) 20 MEQ tablet Take 10 mEq by mouth daily.  03/28/14   Historical Provider, MD  sennosides-docusate sodium (SENOKOT-S) 8.6-50 MG tablet Take 1 tablet by mouth daily.    Historical Provider, MD  simvastatin (ZOCOR) 20 MG tablet TAKE 1 TABLET BY MOUTH EVERY DAY 01/10/14   Historical Provider, MD  traMADol (ULTRAM) 50 MG tablet Take by mouth every 6 (six) hours as needed.    Historical Provider, MD  Vitamin D, Ergocalciferol, (DRISDOL) 50000 UNITS CAPS capsule Take 50,000 Units by mouth every 7 (seven) days.    Historical Provider, MD  warfarin (COUMADIN) 1 MG tablet Take 4 mg by mouth as directed. mon - Sat 5mg , Sun 7.5mg     Historical Provider, MD   BP 175/88 mmHg  Pulse 87  Temp(Src) 97.7 F (36.5 C) (Oral)  Ht 5\' 6"  (1.676 m)  Wt 150 lb (68.04 kg)  BMI 24.22 kg/m2  SpO2 91% Physical Exam  Constitutional: She is oriented to person, place, and time. She appears well-developed and well-nourished. No distress.  HENT:  Head: Normocephalic.  Eyes: Conjunctivae are normal.  Neck: Neck supple. JVD present. No tracheal deviation present.  Cardiovascular: Normal rate, regular rhythm and normal heart sounds.  Exam reveals no gallop.   No murmur heard. Pulmonary/Chest: Effort normal. No respiratory distress. She has no wheezes. She has rales (bibasilar). She exhibits no tenderness.  Abdominal: Soft. She exhibits no distension.  Neurological: She is alert and oriented to person, place, and time.  Skin: Skin is warm and dry.  Psychiatric: She has a normal mood and affect.    ED Course  Procedures (including critical care time)  Emergency Focused Ultrasound Exam Limited Ultrasound of the Heart and Pericardium  Performed and interpreted by Dr. Laneta Simmers Indication:  dyspnea Multiple views of the heart, pericardium, and IVC are obtained with a multi frequency probe.  Findings: nml contractility, no anechoic fluid, no IVC collapse Interpretation: nml ejection fraction, no pericardial effusion, elevated CVP Images archived electronically.  CPT Code: [id number]  Labs Review Labs Reviewed  BASIC METABOLIC PANEL - Abnormal; Notable for the following:    Glucose, Bld 146 (*)    BUN 22 (*)    GFR calc non Af Amer 54 (*)    All other components within normal limits  CBC WITH DIFFERENTIAL/PLATELET - Abnormal; Notable for the following:    RDW 15.9 (*)    Platelets 147 (*)    All other components within normal limits  BRAIN NATRIURETIC PEPTIDE - Abnormal; Notable for the following:    B Natriuretic Peptide 466.6 (*)  All other components within normal limits  PROTIME-INR - Abnormal; Notable for the following:    Prothrombin Time 20.0 (*)    INR 1.70 (*)    All other components within normal limits  BASIC METABOLIC PANEL - Abnormal; Notable for the following:    Potassium 3.3 (*)    Glucose, Bld 101 (*)    Creatinine, Ser 1.06 (*)    GFR calc non Af Amer 45 (*)    GFR calc Af Amer 53 (*)    All other components within normal limits  CBC - Abnormal; Notable for the following:    RBC 3.83 (*)    RDW 16.1 (*)    Platelets 147 (*)    All other components within normal limits  PROTIME-INR - Abnormal; Notable for the following:    Prothrombin Time 20.4 (*)    INR 1.75 (*)    All other components within normal limits  TROPONIN I  MAGNESIUM  BASIC METABOLIC PANEL  PROTIME-INR    Imaging Review Dg Chest 2 View  03/13/2015  CLINICAL DATA:  Cough, shortness of breath. EXAM: CHEST  2 VIEW COMPARISON:  October 08, 2014. FINDINGS: Stable cardiomegaly is noted. Mild central pulmonary vascular congestion is noted. No pneumothorax is noted. Old lower thoracic compression fracture is noted. Increased bibasilar opacities are noted concerning for subsegmental  atelectasis or edema with minimal associated pleural effusions. IMPRESSION: Stable cardiomegaly with mild central pulmonary vascular congestion. Mild bibasilar opacities are noted concerning for subsegmental atelectasis or edema with minimal associated pleural effusions. Electronically Signed   By: Marijo Conception, M.D.   On: 03/13/2015 11:33   I have personally reviewed and evaluated these images and lab results as part of my medical decision-making.   EKG Interpretation   Date/Time:  Wednesday March 13 2015 09:52:43 EST Ventricular Rate:  86 PR Interval:    QRS Duration: 88 QT Interval:  408 QTC Calculation: 488 R Axis:   102 Text Interpretation:  Atrial fibrillation with premature ventricular or  aberrantly conducted complexes Abnormal ECG Confirmed by Monique Gift MD, Quillian Quince  [id number]) on 03/13/2015 9:57:20 AM      MDM   Final diagnoses:  Acute on chronic diastolic congestive heart failure (HCC)  Acute pulmonary edema ([place])    81 year old female presents with progressive shortness of breath over the last 3 days and bilateral lower extremity edema that is concerning for diastolic heart failure in the setting of her chronic atrial fibrillation. She is adequately treated on Coumadin but is subtherapeutic today 1.7.started on lasix for diuresis, placed on supplemental O2. Hospitalist was consulted for admission and accepted the patient in transfer to facility capable of caring for patient further.   Leo Grosser, MD 03/14/15 [phone]

## 2015-03-13 NOTE — ED Notes (Signed)
RN will return call for report when available

## 2015-03-13 NOTE — Consult Note (Signed)
CARDIOLOGY CONSULT NOTE   Patient ID: Tammy Vaughn MRN: [id number], DOB/AGE: 17-Dec-1924   Admit date: 03/13/2015 Date of Consult: 03/13/2015  Primary Physician: Maylon Peppers, MD Primary Cardiologist: Liane Comber, MD   Pt. Profile  81 y/o female with a h/o persistent AF who presents on Tx from [facility] 2/2 dyspnea and evidence of diast chf.  Problem List  Past Medical History  Diagnosis Date  . Arthritis   . Essential hypertension   . Hx of right knee surgery   . Persistent atrial fibrillation ([place])     a. 04/2014 Echo: EF 55-60%, mild LVH, mild to mod AI/MR, sev dil LA, mildly reduced RV fxn, sev dil RA, mild-mod TR, PASP 35mmHg.  . High cholesterol   . Osteoarthritis   . Hyperlipemia   . Burst fracture of thoracic vertebra (HCC)     a. stable burst fracture t9-t10  . CKD (chronic kidney disease), stage III     a. Hypertensive CKD.  . Diverticulosis of large intestine   . Iron deficiency   . Pulmonary hypertension ([place])     a. 04/2014 Echo: PASP 62mmHg.    Past Surgical History  Procedure Laterality Date  . Hip surgery Bilateral   . Abdominal hysterectomy    . Cesarean section    . Total knee arthroplasty Right 06/2011    Allergies  Allergies  Allergen Reactions  . Codeine   . Morphine And Related   . Amlodipine Other (See Comments)   HPI   81 y/o female with a h/o persistent AF, HTN, CKD III, and pulmonary hypertension.  She is on chronic coumadin with INRs followed at her senior living community.  She was previously very active, walking often without significant limitations but in late June, she fell and suffered a thoracic vertebrae burst fx.  Since her fall, she feels as though she never really got back to her prior level of activity.  Nothing specific, just slow to recover.  About 2 wks ago, she began to note exertional dyspnea associated with mild chest tightness.  Ss last a few mins and resolve with rest.  She has never had either dyspnea or chest tightness in  the absence of exertion.  She denies palpitations.  She does not weigh herself @ home.  Over the past 2 wks, dyspnea and chest tightness have been occurring with less and less provocation.  Over the past 3-4 days, she has also noted increasing lower ext edema.  She denies pnd, orthopnea, change in abd girth (though she says she always has a firm abdomen), or early satiety.  She has been compliant with her meds (someone at the senior community prepares her meds in a pill counter, two weeks at a time). Today, after talking with her dtr in law, her family decided that she should be evaluated.  She presented to the [facility] where she was markedly hypertensive (196/100) and mildly hypoxic @ 89% on RA.  BNP was mildly elevated while cxr showed vasc congestion.  She was treated with IV lasix with some improvement in Ss and Tx to [facility] for further eval.  Currently, she says that she is feeling much better.  Inpatient Medications  . carvedilol  12.5 mg Oral BID WC  . celecoxib  200 mg Oral Daily  . cloNIDine  0.3 mg Oral Daily  . furosemide  20 mg Intravenous BID  . isosorbide mononitrate  30 mg Oral Daily  . lactobacillus acidophilus  2 tablet Oral TID  . lisinopril  40 mg Oral Daily  . mirtazapine  7.5 mg Oral QHS  . polyethylene glycol  17 g Oral Daily  . potassium chloride SA  10 mEq Oral Daily  . sennosides-docusate sodium  1 tablet Oral Daily  . simvastatin  20 mg Oral q1800  . sodium chloride  3 mL Intravenous Q12H    Family History Family History  Problem Relation Age of Onset  . Stroke Mother   . Heart attack Neg Hx   . Hypertension Brother      Social History Social History   Social History  . Marital Status: Married    Spouse Name: N/A  . Number of Children: N/A  . Years of Education: N/A   Occupational History  . Not on file.   Social History Main Topics  . Smoking status: Never Smoker   . Smokeless tobacco: Not on file  . Alcohol Use: No  . Drug Use: No  . Sexual Activity:  Not on file   Other Topics Concern  . Not on file   Social History Narrative   Lives in [facility] - has a private apartment.     Review of Systems  General:  No chills, fever, night sweats or weight changes.  Cardiovascular:  +++ ex chest tightness and dyspnea on exertion.  3-4 day h/o LE edema.  No orthopnea, palpitations, paroxysmal nocturnal dyspnea. Dermatological: No rash, lesions/masses Respiratory: No cough, +++ dyspnea Urologic: No hematuria, dysuria Abdominal:   No nausea, vomiting, diarrhea, bright red blood per rectum, melena, or hematemesis Neurologic:  No visual changes, wkns, changes in mental status. All other systems reviewed and are otherwise negative except as noted above.  Physical Exam  Blood pressure 181/138, pulse 85, temperature 98.3 F (36.8 C), temperature source Oral, resp. rate 18, height 5\' 6"  (1.676 m), weight 150 lb (68.04 kg), SpO2 94 %.  General: Pleasant, NAD Psych: Normal affect. Neuro: Alert and oriented X 3. Moves all extremities spontaneously. HEENT: Normal  Neck: Supple without bruits.  JVP ~ 12 cm. Lungs:  Resp regular and unlabored, bibasilar crackles ~ 1/3 way up. Heart: IR, IR no s3, s4, or murmurs. Abdomen: Soft, non-tender, non-distended, BS + x 4.  Extremities: No clubbing, cyanosis.  2+ bilat LE edema. DP/PT/Radials 2+ and equal bilaterally.  Labs  Recent Labs  03/13/15 1105  TROPONINI <0.03   Lab Results  Component Value Date   WBC 7.5 03/13/2015   HGB 12.5 03/13/2015   HCT 38.4 03/13/2015   MCV 98.5 03/13/2015   PLT 147* 03/13/2015     Recent Labs Lab 03/13/15 1105  NA 140  K 3.5  CL 106  CO2 28  BUN 22*  CREATININE 0.92  CALCIUM 9.2  GLUCOSE 146*   BNP 466.6  Lab Results  Component Value Date   INR 1.70* 03/13/2015    Radiology/Studies  Dg Chest 2 View  03/13/2015  CLINICAL DATA:  Cough, shortness of breath. EXAM: CHEST  2 VIEW COMPARISON:  October 08, 2014. FINDINGS: Stable  cardiomegaly is noted. Mild central pulmonary vascular congestion is noted. No pneumothorax is noted. Old lower thoracic compression fracture is noted. Increased bibasilar opacities are noted concerning for subsegmental atelectasis or edema with minimal associated pleural effusions. IMPRESSION: Stable cardiomegaly with mild central pulmonary vascular congestion. Mild bibasilar opacities are noted concerning for subsegmental atelectasis or edema with minimal associated pleural effusions. Electronically Signed   By: Marijo Conception, M.D.   On: 03/13/2015 11:33   ECG  AF, 86, PVC, no acute st/t changes.   ASSESSMENT AND PLAN  1.  Acute Diastolic CHF:  Pt presented to [facility] today with a 2 wk h/o worsening dyspnea associated chest tightness and LE swelling over the past 3-4 days.  Initially noted to have mild hypoxia with O2 sat of 89% on RA.  Markedly hypertensive in ED (196/100) BNP mildly elevated.  CXR with vasc congestion.  She was treated with lasix 40 IV x 1 and says that she is feeling some better.  She remains hypertensive with LEE, crackles, and JVD.  Increase lasix to 40 IV BID (currently ordered 20).  Home meds resumed and she is scheduled to receive coreg, clonidine, imdur, and hydralazine now.  I will add prn hydralazine in case pressures do not improve after scheduled meds.  Echo earlier this year showed nl LV fxn.  In setting of c/o chest tightness, will repeat echo.  2.  Exertional chest tightness:  2 wks h/o exertional chest tightness assoc with dyspnea.  Currently symptom free.  Cycle CE.  If echo abnl or trop +, we will need to consider ischemic eval.  3.  Malignant HTN:  Markedly elevated BP's today.  Pt reports compliance with home meds.  As above, resume home meds tonight.  Adding prn hydralazine. May need ntg infusion if BP recalcitrant to therapy.  4.  CKD III:  Creat stable.  Follow with diuresis.  5.  HL:  Cont statin.  6.  Persistent AFib:  Rate controlled on bb.   Asymptomatic.  INR subRx today.  Coumadin per pharmacy ordered.  If she r/i, we will need to hold this and place on heparin.  Signed, Murray Hodgkins, NP 03/13/2015, 5:10 PM  The patient was seen, examined and discussed with Carmela Rima, NP and agree as above.  81 y/o female with a h/o persistent AF, HTN, CKD III, and severe pulmonary hypertension.  She is on chronic coumadin with INRs followed at her senior living community.  She presented with new DOE and was found to be in acute diastolic dysfunction in the settings of hypertensive urgency and severely elevated blood pressure up to 220 mmHg.   We will start lasix 40 mg iv BID and add hydralazine PRN for better BP control, we will restart her home meds - carvedilol lisinopril, imdur, hydralazine and see if she is compliant.    Dorothy Spark 03/13/2015

## 2015-03-14 ENCOUNTER — Other Ambulatory Visit (HOSPITAL_COMMUNITY): Payer: Medicare Other

## 2015-03-14 ENCOUNTER — Encounter (HOSPITAL_COMMUNITY): Payer: Self-pay | Admitting: General Practice

## 2015-03-14 DIAGNOSIS — I160 Hypertensive urgency: Secondary | ICD-10-CM

## 2015-03-14 LAB — CBC
HCT: 37.5 % (ref 36.0–46.0)
Hemoglobin: 12.4 g/dL (ref 12.0–15.0)
MCH: 32.4 pg (ref 26.0–34.0)
MCHC: 33.1 g/dL (ref 30.0–36.0)
MCV: 97.9 fL (ref 78.0–100.0)
PLATELETS: 147 10*3/uL — AB (ref 150–400)
RBC: 3.83 MIL/uL — AB (ref 3.87–5.11)
RDW: 16.1 % — AB (ref 11.5–15.5)
WBC: 6.3 10*3/uL (ref 4.0–10.5)

## 2015-03-14 LAB — BASIC METABOLIC PANEL
Anion gap: 9 (ref 5–15)
BUN: 17 mg/dL (ref 6–20)
CALCIUM: 9.2 mg/dL (ref 8.9–10.3)
CHLORIDE: 102 mmol/L (ref 101–111)
CO2: 30 mmol/L (ref 22–32)
CREATININE: 1.06 mg/dL — AB (ref 0.44–1.00)
GFR calc non Af Amer: 45 mL/min — ABNORMAL LOW (ref >60)
GFR, EST AFRICAN AMERICAN: 53 mL/min — AB (ref >60)
Glucose, Bld: 101 mg/dL — ABNORMAL HIGH (ref 65–99)
Potassium: 3.3 mmol/L — ABNORMAL LOW (ref 3.5–5.1)
SODIUM: 141 mmol/L (ref 135–145)

## 2015-03-14 LAB — PROTIME-INR
INR: 1.75 — ABNORMAL HIGH (ref 0.00–1.49)
PROTHROMBIN TIME: 20.4 s — AB (ref 11.6–15.2)

## 2015-03-14 MED ORDER — FUROSEMIDE 40 MG PO TABS
40.0000 mg | ORAL_TABLET | Freq: Two times a day (BID) | ORAL | Status: DC
  Administered 2015-03-14 – 2015-03-16 (×4): 40 mg via ORAL
  Filled 2015-03-14 (×4): qty 1

## 2015-03-14 MED ORDER — WARFARIN SODIUM 5 MG PO TABS
5.0000 mg | ORAL_TABLET | Freq: Once | ORAL | Status: AC
  Administered 2015-03-14: 5 mg via ORAL
  Filled 2015-03-14: qty 1

## 2015-03-14 MED ORDER — POTASSIUM CHLORIDE CRYS ER 20 MEQ PO TBCR
40.0000 meq | EXTENDED_RELEASE_TABLET | Freq: Every day | ORAL | Status: AC
  Administered 2015-03-14 – 2015-03-15 (×2): 40 meq via ORAL
  Filled 2015-03-14 (×2): qty 2

## 2015-03-14 NOTE — Care Management Note (Addendum)
Case Management Note Marvetta Gibbons RN, BSN Unit 2W-Case Manager [phone]  Patient Details  Name: Tammy Vaughn MRN: [id number] Date of Birth: Jun 03, 1924  Subjective/Objective:     Pt admitted with CHF               Action/Plan: PTA pt lived alone at [place] apartment- anticipate return to [place] at [facility]- spoke with pt at bedside regarding potential d/c needs per pt she has sons that live nearby that check on her- she also reports that she has resources at [facility]. Asked pt if she has a scale and weighs herself- she answered no but states that she can go the the clinic at [facility] and weigh herself if needed. Explained that this might be a good idea to keep a watch on her fluid gain/loss. Discussed HH needs- pt reports that she has and uses a walker, no other DME. She does not have any HH services currently and states that she does not feel she needs any at this time. Reports that Dr. Meda Coffee is assisting her with managing her medications. CM will continue to follow pt for [facility] and d/c needs- pt politely refusing HH at this time.   Expected Discharge Date:                  Expected Discharge Plan:  [place]  In-House Referral:     Discharge planning Services  CM Consult  Post Acute Care Choice:    Choice offered to:     DME Arranged:    DME Agency:     HH Arranged:    [place] Agency:     Status of Service:  In process, will continue to follow  Medicare Important Message Given:    Date Medicare IM Given:    Medicare IM give by:    Date Additional Medicare IM Given:    Additional Medicare Important Message give by:     If discussed at [place] of Stay Meetings, dates discussed:    Additional Comments:  Dawayne Patricia, RN 03/14/2015, 3:12 PM

## 2015-03-14 NOTE — Progress Notes (Signed)
Patient Name: Tammy Vaughn Date of Encounter: 03/14/2015  Principal Problem:   Acute diastolic CHF (congestive heart failure) (HCC) Active Problems:   Persistent atrial fibrillation ([place])   Hyperlipemia   CKD (chronic kidney disease), stage III   Pulmonary hypertension ([place])   Essential hypertension   Length of Stay: 1  SUBJECTIVE  The patient feels better today, no chest pain, improved SOB.  CURRENT MEDS . carvedilol  12.5 mg Oral BID WC  . cloNIDine  0.3 mg Oral Daily  . furosemide  40 mg Intravenous BID  . isosorbide mononitrate  30 mg Oral Daily  . lisinopril  40 mg Oral Daily  . mirtazapine  7.5 mg Oral QHS  . polyethylene glycol  17 g Oral Daily  . potassium chloride SA  10 mEq Oral Daily  . senna-docusate  1 tablet Oral Daily  . simvastatin  20 mg Oral q1800  . sodium chloride  3 mL Intravenous Q12H  . warfarin  5 mg Oral ONCE-1800  . Warfarin - Pharmacist Dosing Inpatient   Does not apply q1800    OBJECTIVE  Filed Vitals:   03/13/15 1412 03/13/15 1522 03/13/15 2112 03/14/15 0302  BP: 174/94 181/138 108/54 125/69  Pulse: 74 85 69 74  Temp:  98.3 F (36.8 C) 98 F (36.7 C) 97.8 F (36.6 C)  TempSrc:  Oral Oral Oral  Resp: 18 18 18 18   Height:      Weight:    149 lb (67.586 kg)  SpO2: 97% 94% 93% 96%    Intake/Output Summary (Last 24 hours) at 03/14/15 0857 Last data filed at 03/13/15 2200  Gross per 24 hour  Intake    360 ml  Output    850 ml  Net   -490 ml   Filed Weights   03/13/15 0947 03/14/15 0302  Weight: 150 lb (68.04 kg) 149 lb (67.586 kg)    PHYSICAL EXAM  General: Pleasant, NAD. Neuro: Alert and oriented X 3. Moves all extremities spontaneously. Psych: Normal affect. HEENT:  Normal  Neck: Supple without bruits or JVD. Lungs:  Resp regular and unlabored, CTA. Heart: RRR no s3, s4, or murmurs. Abdomen: Soft, non-tender, non-distended, BS + x 4.  Extremities: No clubbing, cyanosis or edema. DP/PT/Radials 2+ and equal  bilaterally.  Accessory Clinical Findings  CBC  Recent Labs  03/13/15 1105 03/14/15 0257  WBC 7.5 6.3  NEUTROABS 5.5  --   HGB 12.5 12.4  HCT 38.4 37.5  MCV 98.5 97.9  PLT 147* Q000111Q*   Basic Metabolic Panel  Recent Labs  03/13/15 1105 03/14/15 0257  NA 140 141  K 3.5 3.3*  CL 106 102  CO2 28 30  GLUCOSE 146* 101*  BUN 22* 17  CREATININE 0.92 1.06*  CALCIUM 9.2 9.2  MG 2.1  --    Cardiac Enzymes  Recent Labs  03/13/15 1105  TROPONINI <0.03   Radiology/Studies  Dg Chest 2 View  03/13/2015  CLINICAL DATA:  Cough, shortness of breath. EXAM: CHEST  2 VIEW COMPARISON:  October 08, 2014. FINDINGS: Stable cardiomegaly is noted. Mild central pulmonary vascular congestion is noted. No pneumothorax is noted. Old lower thoracic compression fracture is noted. Increased bibasilar opacities are noted concerning for subsegmental atelectasis or edema with minimal associated pleural effusions. IMPRESSION: Stable cardiomegaly with mild central pulmonary vascular congestion. Mild bibasilar opacities are noted concerning for subsegmental atelectasis or edema with minimal associated pleural effusions. Electronically Signed   By: Marijo Conception, M.D.  On: 03/13/2015 11:33   TELE: SR    ASSESSMENT AND PLAN  1. Acute Diastolic CHF: Pt presented to [facility] today with a 2 wk h/o worsening dyspnea associated chest tightness and LE swelling over the past 3-4 days. Initially noted to have mild hypoxia with O2 sat of 89% on RA. Markedly hypertensive in ED (196/100) BNP mildly elevated. CXR with vasc congestion. She was treated with lasix 40 IV x 1 and says that she is feeling some better. She remains hypertensive with LEE, crackles, and JVD. Increase lasix to 40 IV BID (currently ordered 20). Home meds resumed and she is scheduled to receive coreg, clonidine, imdur, and hydralazine now. I will add prn hydralazine in case pressures do not improve after scheduled meds. Echo earlier this year  showed nl LV fxn. In setting of c/o chest tightness, will repeat echo.  2. Exertional chest tightness: 2 wks h/o exertional chest tightness assoc with dyspnea. Currently symptom free. Cycle CE. If echo abnl or trop +, we will need to consider ischemic eval.  3. Malignant HTN: Markedly elevated BP's today. Pt reports compliance with home meds. As above, resume home meds tonight. Adding prn hydralazine. May need ntg infusion if BP recalcitrant to therapy.  4. CKD III: Creat stable. Follow with diuresis.  5. HL: Cont statin.  6. Persistent AFib: Rate controlled on bb. Asymptomatic. INR subRx today. Coumadin per pharmacy ordered. If she r/i, we will need to hold this and place on heparin.   81 y/o female with a h/o persistent AF, HTN, CKD III, and severe pulmonary hypertension. She is on chronic coumadin with INRs [facility] community. She presented with new DOE and was found to be in acute diastolic dysfunction in the settings of hypertensive urgency and severely elevated blood pressure up to 220 mmHg.  She has diuresed - 500 cc overnight and feels better, on physical exam less fluid overload, we will continue iv Lasix for another day and anticipate discharge tomorrow.  Her BP is now better controlled.  Signed, Tammy Spark MD, [facility] 03/14/2015

## 2015-03-14 NOTE — Research (Signed)
REDS_[id number]  Study Informed Consent   Subject Name:Tammy Vaughn Subject met inclusion and exclusion criteria. The informed consent form, study requirements and expectations were reviewed with the subject and questions and concerns were addressed prior to the signing of the consent form. The subject verbalized understanding of the trial requirements. The subject agreed to participate in the Reds_1  trial and signed the informed consent. The informed consent was obtained prior to performance of any protocol-specific procedures for the subject. A copy of the signed informed consent was given to the subject and a copy was placed in the subject's medical record.  Jake Bathe, RN 03/14/1015 1515

## 2015-03-14 NOTE — Progress Notes (Signed)
Utilization review completed.  

## 2015-03-14 NOTE — Progress Notes (Signed)
ANTICOAGULATION CONSULT NOTE - Follow Up Consult  Pharmacy Consult for warfarim Indication: atrial fibrillation  Patient Measurements: Height: 5\' 6"  (167.6 cm) Weight: 149 lb (67.586 kg) IBW/kg (Calculated) : 59.3  Vital Signs: Temp: 97.8 F (36.6 C) (12/01 0302) Temp Source: Oral (12/01 0302) BP: 125/69 mmHg (12/01 0302) Pulse Rate: 74 (12/01 0302)  Labs:  Recent Labs  03/13/15 1105 03/14/15 0257  HGB 12.5 12.4  HCT 38.4 37.5  PLT 147* 147*  LABPROT 20.0* 20.4*  INR 1.70* 1.75*  CREATININE 0.92 1.06*  TROPONINI <0.03  --     Assessment: 89 yoF on warfarin PTA for afib admitted with dyspnea, LE swelling, likely diastolic HF. Pharmacy to dose warfarin inpatient.  PTA dose of warfarin was 4 mg daily, last dose on 11/29, admit INR subtherapeutic 1.7. INR now 1.75 today. No bleed documented.  Goal of Therapy:  INR 2-3 Monitor platelets by anticoagulation protocol: Yes   Plan:  Warfarin 5 mg x 1 tonight Daily INR Mon s/sx bleeding   Elicia Lamp, PharmD, [facility] Clinical Pharmacist Pager [phone] 03/14/2015 8:47 AM

## 2015-03-14 NOTE — Research (Signed)
This patient has enrolled in the Reds@Discharge  research study.  They will have a vest reading on the morning of discharge.  The reading will be performed by either Bonnita Nasuti, Pharmacist or Sandie Ano, Research Nurse.

## 2015-03-14 NOTE — Clinical Documentation Improvement (Signed)
Internal Medicine  Can the diagnosis of hypoxemia be further specified? Please update your documentation within the medical record to reflect your response to this query. Do not document in BPA drop down box; findings belong in progress note.Thank you!   Acute Hypoxic Respiratory Failure  Hypoxia as documented  Other  Clinically Undetermined  Supporting Information:  RR as high as 45 with sats ranging from 87 to 91% in room air  Placed in 2L FiO2 with improvement of sats to mid to upper 90's  Please exercise your independent, professional judgment when responding. A specific answer is not anticipated or expected.  Thank You, Zoila Shutter RN, [place] [phone]; Cell: [phone]

## 2015-03-14 NOTE — Progress Notes (Signed)
[facility] PROGRESS NOTE  Tammy Vaughn [id number] DOB: 18-Jan-1925 DOA: 03/13/2015 PCP: Maylon Peppers, MD  Assessment/Plan: 1. Hypotension- patient's blood pressure has been on the lower side with systolic blood pressure less than 100. Will hold Catapres and lisinopril at this time. Continue Coreg. 2. Acute on chronic diastolic CHF- currently on Lasix 40 mg IV every 12 hours. Cardiology following. 3. Atrial fibrillation- heart rate controlled, continue Coreg. Patient on Coumadin. Diltiazem 60 mg when necessary for heart rate more than 120. 4. Depression- continue Remeron 5. Chronic pain syndrome- continue Vicodin, Celebrex 6. DVT prophylaxis- patient on Coumadin  Code Status: Full code Family Communication: *No family present at bedside Disposition Plan: Pending improvement in CHF   Consultants:  Cardiology  Procedures:  None  Antibiotics:  None  HPI/Subjective: 81 y.o. female with gradual onset of shortness of breath  Constant. gettign worse. Nothing makes symptoms better. Associated w/ LE swelling that is constant and getting worse.. No home O2. Patient unsure she has gained or lost weight recently. Patient is unsure of her home medication regimen as she states that her son placed the medications out and she takes them. No recent medication changes.  This morning breathing is improved. Blood pressure is low.   Objective: Filed Vitals:   03/14/15 1230 03/14/15 1326  BP: 80/48 92/44  Pulse:    Temp:  97.5 F (36.4 C)  Resp:  17    Intake/Output Summary (Last 24 hours) at 03/14/15 1429 Last data filed at 03/14/15 1300  Gross per 24 hour  Intake    840 ml  Output   1250 ml  Net   -410 ml   Filed Weights   03/13/15 0947 03/14/15 0302  Weight: 68.04 kg (150 lb) 67.586 kg (149 lb)    Exam:   General:  Appears in no acute distress  Cardiovascular: S1-S2 regular  Respiratory: Clear to auscultation bilaterally  Abdomen: Soft, nontender, no  organomegaly  Musculoskeletal: No edema of the lower extremities noted   Data Reviewed: Basic Metabolic Panel:  Recent Labs Lab 03/13/15 1105 03/14/15 0257  NA 140 141  K 3.5 3.3*  CL 106 102  CO2 28 30  GLUCOSE 146* 101*  BUN 22* 17  CREATININE 0.92 1.06*  CALCIUM 9.2 9.2  MG 2.1  --    CBC:  Recent Labs Lab 03/13/15 1105 03/14/15 0257  WBC 7.5 6.3  NEUTROABS 5.5  --   HGB 12.5 12.4  HCT 38.4 37.5  MCV 98.5 97.9  PLT 147* 147*   Cardiac Enzymes:  Recent Labs Lab 03/13/15 1105  TROPONINI <0.03   BNP (last 3 results)  Recent Labs  03/13/15 1105  BNP 466.6*    ProBNP (last 3 results)  Recent Labs  03/24/14 0845  PROBNP 3197.0*    CBG: No results for input(s): GLUCAP in the last 168 hours.  No results found for this or any previous visit (from the past 240 hour(s)).   Studies: Dg Chest 2 View  03/13/2015  CLINICAL DATA:  Cough, shortness of breath. EXAM: CHEST  2 VIEW COMPARISON:  October 08, 2014. FINDINGS: Stable cardiomegaly is noted. Mild central pulmonary vascular congestion is noted. No pneumothorax is noted. Old lower thoracic compression fracture is noted. Increased bibasilar opacities are noted concerning for subsegmental atelectasis or edema with minimal associated pleural effusions. IMPRESSION: Stable cardiomegaly with mild central pulmonary vascular congestion. Mild bibasilar opacities are noted concerning for subsegmental atelectasis or edema with minimal associated pleural effusions. Electronically Signed   By:  Marijo Conception, M.D.   On: 03/13/2015 11:33    Scheduled Meds: . carvedilol  12.5 mg Oral BID WC  . cloNIDine  0.3 mg Oral Daily  . furosemide  40 mg Intravenous BID  . isosorbide mononitrate  30 mg Oral Daily  . mirtazapine  7.5 mg Oral QHS  . polyethylene glycol  17 g Oral Daily  . potassium chloride SA  10 mEq Oral Daily  . senna-docusate  1 tablet Oral Daily  . simvastatin  20 mg Oral q1800  . sodium chloride  3 mL  Intravenous Q12H  . warfarin  5 mg Oral ONCE-1800  . Warfarin - Pharmacist Dosing Inpatient   Does not apply q1800   Continuous Infusions:   Principal Problem:   Acute diastolic CHF (congestive heart failure) (HCC) Active Problems:   Persistent atrial fibrillation ([place])   Hyperlipemia   CKD (chronic kidney disease), stage III   Pulmonary hypertension ([place])   Essential hypertension    Time spent: 25 min    [facility] Pager [phone]. If 7PM-7AM, please contact night-coverage at [email or web address], password [facility] 03/14/2015, 2:29 PM  LOS: 1 day

## 2015-03-15 ENCOUNTER — Inpatient Hospital Stay (HOSPITAL_COMMUNITY): Payer: Medicare Other

## 2015-03-15 DIAGNOSIS — J810 Acute pulmonary edema: Secondary | ICD-10-CM | POA: Insufficient documentation

## 2015-03-15 DIAGNOSIS — I509 Heart failure, unspecified: Secondary | ICD-10-CM

## 2015-03-15 LAB — PROTIME-INR
INR: 1.91 — AB (ref 0.00–1.49)
Prothrombin Time: 21.8 seconds — ABNORMAL HIGH (ref 11.6–15.2)

## 2015-03-15 LAB — BASIC METABOLIC PANEL
ANION GAP: 8 (ref 5–15)
BUN: 18 mg/dL (ref 6–20)
CO2: 32 mmol/L (ref 22–32)
Calcium: 9.1 mg/dL (ref 8.9–10.3)
Chloride: 102 mmol/L (ref 101–111)
Creatinine, Ser: 1.13 mg/dL — ABNORMAL HIGH (ref 0.44–1.00)
GFR calc Af Amer: 49 mL/min — ABNORMAL LOW (ref >60)
GFR calc non Af Amer: 42 mL/min — ABNORMAL LOW (ref >60)
GLUCOSE: 103 mg/dL — AB (ref 65–99)
POTASSIUM: 3.5 mmol/L (ref 3.5–5.1)
Sodium: 142 mmol/L (ref 135–145)

## 2015-03-15 MED ORDER — LISINOPRIL 10 MG PO TABS
10.0000 mg | ORAL_TABLET | Freq: Every day | ORAL | Status: DC
  Administered 2015-03-15 – 2015-03-16 (×2): 10 mg via ORAL
  Filled 2015-03-15 (×2): qty 1

## 2015-03-15 MED ORDER — WARFARIN SODIUM 4 MG PO TABS
4.0000 mg | ORAL_TABLET | Freq: Once | ORAL | Status: AC
  Administered 2015-03-15: 4 mg via ORAL
  Filled 2015-03-15: qty 1

## 2015-03-15 NOTE — Progress Notes (Signed)
Patient Name: Tammy Vaughn Date of Encounter: 03/15/2015  Principal Problem:   Acute diastolic CHF (congestive heart failure) (HCC) Active Problems:   Persistent atrial fibrillation ([place])   Hyperlipemia   CKD (chronic kidney disease), stage III   Pulmonary hypertension ([place])   Essential hypertension   Length of Stay: 2  SUBJECTIVE  The patient feels better today, no chest pain, improved SOB.  CURRENT MEDS . carvedilol  12.5 mg Oral BID WC  . furosemide  40 mg Oral BID  . isosorbide mononitrate  30 mg Oral Daily  . mirtazapine  7.5 mg Oral QHS  . polyethylene glycol  17 g Oral Daily  . potassium chloride  40 mEq Oral Daily  . senna-docusate  1 tablet Oral Daily  . simvastatin  20 mg Oral q1800  . sodium chloride  3 mL Intravenous Q12H  . Warfarin - Pharmacist Dosing Inpatient   Does not apply q1800   OBJECTIVE  Filed Vitals:   03/14/15 1326 03/14/15 1815 03/14/15 2032 03/15/15 0513  BP: 92/44 116/50 120/68 156/72  Pulse:  72 63   Temp: 97.5 F (36.4 C)  97.9 F (36.6 C) 98 F (36.7 C)  TempSrc: Oral  Oral Oral  Resp: 17 18 18 18   Height:      Weight:    145 lb 8.1 oz (66 kg)  SpO2: 94%  95% 92%    Intake/Output Summary (Last 24 hours) at 03/15/15 1104 Last data filed at 03/15/15 0800  Gross per 24 hour  Intake    720 ml  Output   1050 ml  Net   -330 ml   Filed Weights   03/13/15 0947 03/14/15 0302 03/15/15 0513  Weight: 150 lb (68.04 kg) 149 lb (67.586 kg) 145 lb 8.1 oz (66 kg)   PHYSICAL EXAM  General: Pleasant, NAD. Neuro: Alert and oriented X 3. Moves all extremities spontaneously. Psych: Normal affect. HEENT:  Normal  Neck: Supple without bruits or JVD. Lungs:  Resp regular and unlabored, CTA. Heart: RRR no s3, s4, or murmurs. Abdomen: Soft, non-tender, non-distended, BS + x 4.  Extremities: No clubbing, cyanosis or edema. DP/PT/Radials 2+ and equal bilaterally.  Accessory Clinical Findings  CBC  Recent Labs  03/13/15 1105  03/14/15 0257  WBC 7.5 6.3  NEUTROABS 5.5  --   HGB 12.5 12.4  HCT 38.4 37.5  MCV 98.5 97.9  PLT 147* Q000111Q*   Basic Metabolic Panel  Recent Labs  03/13/15 1105 03/14/15 0257 03/15/15 0454  NA 140 141 142  K 3.5 3.3* 3.5  CL 106 102 102  CO2 28 30 32  GLUCOSE 146* 101* 103*  BUN 22* 17 18  CREATININE 0.92 1.06* 1.13*  CALCIUM 9.2 9.2 9.1  MG 2.1  --   --    Cardiac Enzymes  Recent Labs  03/13/15 1105  TROPONINI <0.03   Radiology/Studies  Dg Chest 2 View  03/13/2015  CLINICAL DATA:  Cough, shortness of breath. EXAM: CHEST  2 VIEW COMPARISON:  October 08, 2014. FINDINGS: Stable cardiomegaly is noted. Mild central pulmonary vascular congestion is noted. No pneumothorax is noted. Old lower thoracic compression fracture is noted. Increased bibasilar opacities are noted concerning for subsegmental atelectasis or edema with minimal associated pleural effusions. IMPRESSION: Stable cardiomegaly with mild central pulmonary vascular congestion. Mild bibasilar opacities are noted concerning for subsegmental atelectasis or edema with minimal associated pleural effusions. Electronically Signed   By: Marijo Conception, M.D.   On: 03/13/2015 11:33  TELE: SR   ASSESSMENT AND PLAN  1. Acute Diastolic CHF: Pt presented to [facility] today with a 2 wk h/o worsening dyspnea associated chest tightness and LE swelling over the past 3-4 days. Initially noted to have mild hypoxia with O2 sat of 89% on RA. Markedly hypertensive in ED (196/100) BNP mildly elevated. CXR with vasc congestion. She was treated with lasix 40 IV x 1 and says that she is feeling some better. She remains hypertensive with LEE, crackles, and JVD. Increase lasix to 40 IV BID (currently ordered 20). Home meds resumed and she is scheduled to receive coreg, clonidine, imdur, and hydralazine now. I will add prn hydralazine in case pressures do not improve after scheduled meds. Echo earlier this year showed nl LV fxn. In setting  of c/o chest tightness, will repeat echo.  2. Exertional chest tightness: 2 wks h/o exertional chest tightness assoc with dyspnea. Currently symptom free. Cycle CE. If echo abnl or trop +, we will need to consider ischemic eval.  3. Malignant HTN: Markedly elevated BP's today. Pt reports compliance with home meds. As above, resume home meds tonight. Adding prn hydralazine. May need ntg infusion if BP recalcitrant to therapy.  4. CKD III: Creat stable. Follow with diuresis.  5. HL: Cont statin.  6. Persistent AFib: Rate controlled on bb. Asymptomatic. INR subRx today. Coumadin per pharmacy ordered. If she r/i, we will need to hold this and place on heparin.   81 y/o female with a h/o persistent AF, HTN, CKD III, and severe pulmonary hypertension. She is on chronic coumadin with INRs followed at her senior living community. She presented with new DOE and was found to be in acute diastolic dysfunction in the settings of hypertensive urgency and severely elevated blood pressure up to 220 mmHg.  She has diuresed - 500 cc overnight and feels better, on physical exam less fluid overload, we will continue iv Lasix for another day and anticipate discharge tomorrow.  Her BP is now better controlled.  Signed, [name], [facility] 03/15/2015  The patient was seen, examined and discussed with Bernerd Pho, PA-C and I agree with the above.    81 y/o female with a h/o persistent AF, HTN, CKD III, and severe pulmonary hypertension. She is on chronic coumadin with INRs followed at her senior living community. She presented with new DOE and was found to be in acute diastolic dysfunction in the settings of hypertensive urgency and severely elevated blood pressure up to 220 mmHg.  There is a suspicion that her meds were not given to her properly as she was hypotensive after we restarted her home meds here.  She has responded well to iv lasix, now switched to PO, her crea has  slightly increased, I would continue carvedilol 12.5 mg po BID, restart lisinopril 10 mg po daily, lasix 40 mg po BID. If she is stable I would anticipate discharge tomorrow in the morning.    Dorothy Spark 03/15/2015

## 2015-03-15 NOTE — Progress Notes (Signed)
  Echocardiogram 2D Echocardiogram has been performed.  Tammy Vaughn 03/15/2015, 12:17 PM

## 2015-03-15 NOTE — Progress Notes (Signed)
PHARMACY NOTE  Consult :  Coumadin Indication :  atrial fibrillation   Coumadin Dosing Wt :  66 kg  LABS :  Recent Labs  03/13/15 1105 03/14/15 0257 03/15/15 0454  HGB 12.5 12.4  --   HCT 38.4 37.5  --   PLT 147* 147*  --   LABPROT 20.0* 20.4* 21.8*  INR 1.70* 1.75* 1.91*  CREATININE 0.92 1.06* 1.13*    MEDICATION: Medication PTA: Medication Sig  . warfarin (COUMADIN) 4 MG tablet Take 4 mg by mouth daily.   Scheduled:  Scheduled:  . carvedilol  12.5 mg Oral BID WC  . furosemide  40 mg Oral BID  . isosorbide mononitrate  30 mg Oral Daily  . mirtazapine  7.5 mg Oral QHS  . polyethylene glycol  17 g Oral Daily  . potassium chloride  40 mEq Oral Daily  . senna-docusate  1 tablet Oral Daily  . simvastatin  20 mg Oral q1800  . sodium chloride  3 mL Intravenous Q12H  . Warfarin - Pharmacist Dosing Inpatient   Does not apply q1800   ASSESSMENT :  81 y.o. female is currently on chronic Coumadin for atrial fibrillation    Today's INR trending up.   INR  1.91  No evidence of bleeding complications observed.  GOAL :  TARGET INR 2-3   PLAN : 1. Coumadin 4 mg po x 1 today. 2. Daily INR's, CBC. Monitor for bleeding complications.   Follow Platelet counts.  Marthenia Rolling,  Pharm.D   03/15/2015,  12:02 PM

## 2015-03-15 NOTE — Progress Notes (Signed)
[facility] PROGRESS NOTE  Tammy Vaughn [id number] DOB: Feb 11, 1925 DOA: 03/13/2015 PCP: Maylon Peppers, MD  Assessment/Plan: 1. Hypotension-improved after holding antihypertensive medications. Will continue to hold Catapres, lisinopril has been restarted continue Coreg. . 2. Acute on chronic diastolic CHF- patient was started on IV Lasix 40 mg every 12 hours, she has diuresed well at this time Lasix has been changed to 40 mg by mouth twice a day 3. Atrial fibrillation- heart rate controlled, continue Coreg. Patient on Coumadin. Diltiazem 60 mg when necessary for heart rate more than 120. 4. Depression- continue Remeron 5. Chronic pain syndrome- continue Vicodin, Celebrex 6. DVT prophylaxis- patient on Coumadin  Code Status: Full code Family Communication: *No family present at bedside Disposition Plan: Pending improvement in CHF, likely home in a.m.   Consultants:  Cardiology  Procedures:  None  Antibiotics:  None  HPI/Subjective: 81 y.o. female with gradual onset of shortness of breath  Constant. gettign worse. Nothing makes symptoms better. Associated w/ LE swelling that is constant and getting worse.. No home O2. Patient unsure she has gained or lost weight recently. Patient is unsure of her home medication regimen as she states that her son placed the medications out and she takes them. No recent medication changes.  This morning patient denies chest pain or shortness of breath.  Objective: Filed Vitals:   03/15/15 0513 03/15/15 1100  BP: 156/72 145/72  Pulse:  72  Temp: 98 F (36.7 C)   Resp: 18     Intake/Output Summary (Last 24 hours) at 03/15/15 1340 Last data filed at 03/15/15 1300  Gross per 24 hour  Intake    720 ml  Output    850 ml  Net   -130 ml   Filed Weights   03/13/15 0947 03/14/15 0302 03/15/15 0513  Weight: 68.04 kg (150 lb) 67.586 kg (149 lb) 66 kg (145 lb 8.1 oz)    Exam:   General:  Appears in no acute  distress  Cardiovascular: S1-S2 regular  Respiratory: Clear to auscultation bilaterally  Abdomen: Soft, nontender, no organomegaly  Musculoskeletal: No edema of the lower extremities noted   Data Reviewed: Basic Metabolic Panel:  Recent Labs Lab 03/13/15 1105 03/14/15 0257 03/15/15 0454  NA 140 141 142  K 3.5 3.3* 3.5  CL 106 102 102  CO2 28 30 32  GLUCOSE 146* 101* 103*  BUN 22* 17 18  CREATININE 0.92 1.06* 1.13*  CALCIUM 9.2 9.2 9.1  MG 2.1  --   --    CBC:  Recent Labs Lab 03/13/15 1105 03/14/15 0257  WBC 7.5 6.3  NEUTROABS 5.5  --   HGB 12.5 12.4  HCT 38.4 37.5  MCV 98.5 97.9  PLT 147* 147*   Cardiac Enzymes:  Recent Labs Lab 03/13/15 1105  TROPONINI <0.03   BNP (last 3 results)  Recent Labs  03/13/15 1105  BNP 466.6*    ProBNP (last 3 results)  Recent Labs  03/24/14 0845  PROBNP 3197.0*    CBG: No results for input(s): GLUCAP in the last 168 hours.  No results found for this or any previous visit (from the past 240 hour(s)).   Studies: No results found.  Scheduled Meds: . carvedilol  12.5 mg Oral BID WC  . furosemide  40 mg Oral BID  . isosorbide mononitrate  30 mg Oral Daily  . lisinopril  10 mg Oral Daily  . mirtazapine  7.5 mg Oral QHS  . polyethylene glycol  17 g Oral Daily  .  senna-docusate  1 tablet Oral Daily  . simvastatin  20 mg Oral q1800  . sodium chloride  3 mL Intravenous Q12H  . warfarin  4 mg Oral ONCE-1800  . Warfarin - Pharmacist Dosing Inpatient   Does not apply q1800   Continuous Infusions:   Principal Problem:   Acute diastolic CHF (congestive heart failure) (HCC) Active Problems:   Persistent atrial fibrillation ([place])   Hyperlipemia   CKD (chronic kidney disease), stage III   Pulmonary hypertension ([place])   Essential hypertension    Time spent: 25 min    [facility] Pager [phone]. If 7PM-7AM, please contact night-coverage at [email or web address], password [facility] 03/15/2015,  1:40 PM  LOS: 2 days

## 2015-03-16 LAB — BASIC METABOLIC PANEL
Anion gap: 9 (ref 5–15)
BUN: 19 mg/dL (ref 6–20)
CO2: 30 mmol/L (ref 22–32)
CREATININE: 1.08 mg/dL — AB (ref 0.44–1.00)
Calcium: 9.2 mg/dL (ref 8.9–10.3)
Chloride: 102 mmol/L (ref 101–111)
GFR calc Af Amer: 52 mL/min — ABNORMAL LOW (ref >60)
GFR, EST NON AFRICAN AMERICAN: 44 mL/min — AB (ref >60)
Glucose, Bld: 109 mg/dL — ABNORMAL HIGH (ref 65–99)
Potassium: 3.2 mmol/L — ABNORMAL LOW (ref 3.5–5.1)
SODIUM: 141 mmol/L (ref 135–145)

## 2015-03-16 LAB — PROTIME-INR
INR: 2.16 — ABNORMAL HIGH (ref 0.00–1.49)
PROTHROMBIN TIME: 23.9 s — AB (ref 11.6–15.2)

## 2015-03-16 MED ORDER — WARFARIN SODIUM 4 MG PO TABS: 4.0000 mg | ORAL_TABLET | Freq: Once | ORAL | Status: DC

## 2015-03-16 MED ORDER — LISINOPRIL 10 MG PO TABS: 10.0000 mg | ORAL_TABLET | Freq: Every day | ORAL | Status: DC

## 2015-03-16 MED ORDER — POTASSIUM CHLORIDE CRYS ER 20 MEQ PO TBCR
40.0000 meq | EXTENDED_RELEASE_TABLET | ORAL | Status: AC
  Administered 2015-03-16 (×2): 40 meq via ORAL
  Filled 2015-03-16: qty 2

## 2015-03-16 MED ORDER — CARVEDILOL 12.5 MG PO TABS: 12.5000 mg | ORAL_TABLET | Freq: Two times a day (BID) | ORAL | Status: DC

## 2015-03-16 MED ORDER — FUROSEMIDE 40 MG PO TABS: 40.0000 mg | ORAL_TABLET | Freq: Every day | ORAL | Status: DC

## 2015-03-16 MED ORDER — POTASSIUM CHLORIDE CRYS ER 10 MEQ PO TBCR: 20.0000 meq | EXTENDED_RELEASE_TABLET | Freq: Every day | ORAL | Status: DC

## 2015-03-16 MED ORDER — SIMVASTATIN 20 MG PO TABS: 20.0000 mg | ORAL_TABLET | Freq: Every day | ORAL | Status: DC

## 2015-03-16 MED ORDER — SENNA-DOCUSATE SODIUM 8.6-50 MG PO TABS: 1.0000 | ORAL_TABLET | Freq: Every day | ORAL | Status: DC

## 2015-03-16 NOTE — Progress Notes (Signed)
Patient to D/C to [place] with son. Son at bedside. Patient education done. Handout given. IV removed. Tele box removed. Patient taken to front to d/c to [place] with son.   Domingo Dimes RN

## 2015-03-16 NOTE — Care Management Important Message (Signed)
Important Message  Patient Details  Name: Margel Talamantes MRN: [id number] Date of Birth: [birth year]   Medicare Important Message Given:  Yes    Anuel Sitter Abena 03/16/2015, 1:00 PM

## 2015-03-16 NOTE — Research (Signed)
ReDS Vest Discharge Study  Results of ReDS reading  Your patient is in the Blinded arm of the Vest at Discharge study.  Your patient has had a ReDS Vest reading and the reading has been transmitted to the cloud.  Your patient is ok for discharge.    Thank You   The research team    

## 2015-03-16 NOTE — Progress Notes (Signed)
SUBJECTIVE: The patient is doing well today.  She wants to go home.  At this time, she denies chest pain, shortness of breath, or any new concerns.  . carvedilol  12.5 mg Oral BID WC  . furosemide  40 mg Oral BID  . isosorbide mononitrate  30 mg Oral Daily  . lisinopril  10 mg Oral Daily  . mirtazapine  7.5 mg Oral QHS  . polyethylene glycol  17 g Oral Daily  . potassium chloride  40 mEq Oral Q4H  . senna-docusate  1 tablet Oral Daily  . simvastatin  20 mg Oral q1800  . sodium chloride  3 mL Intravenous Q12H  . Warfarin - Pharmacist Dosing Inpatient   Does not apply q1800      OBJECTIVE: Physical Exam: Filed Vitals:   03/15/15 1407 03/15/15 2038 03/16/15 0512 03/16/15 0818  BP: 121/59 140/58 153/85 150/84  Pulse: 77 88 76 72  Temp: 97.6 F (36.4 C) 98 F (36.7 C) 97.8 F (36.6 C)   TempSrc: Oral Oral Oral   Resp: 17 18 18    Height:      Weight:   143 lb 6.4 oz (65.046 kg)   SpO2: 93% 95% 95%     Intake/Output Summary (Last 24 hours) at 03/16/15 1023 Last data filed at 03/15/15 1406  Gross per 24 hour  Intake    240 ml  Output    201 ml  Net     39 ml    Telemetry reveals rate controlled afib  GEN- The patient is elderly appearing, alert and oriented x 3 today.   Head- normocephalic, atraumatic Eyes-  Sclera clear, conjunctiva pink Ears- hearing intact Oropharynx- clear Neck- supple, + JVD Lungs- few basilar rales, mostly clear, normal work of breathing Heart- irregular rate and rhythm  GI- soft, NT, ND, + BS Extremities- no clubbing, cyanosis, or edema Skin- no rash or lesion Psych- euthymic mood, full affect Neuro- strength and sensation are intact  LABS: Basic Metabolic Panel:  Recent Labs  03/13/15 1105  03/15/15 0454 03/16/15 0322  NA 140  < > 142 141  K 3.5  < > 3.5 3.2*  CL 106  < > 102 102  CO2 28  < > 32 30  GLUCOSE 146*  < > 103* 109*  BUN 22*  < > 18 19  CREATININE 0.92  < > 1.13* 1.08*  CALCIUM 9.2  < > 9.1 9.2  MG 2.1  --   --    --   < > = values in this interval not displayed. Liver Function Tests: No results for input(s): AST, ALT, ALKPHOS, BILITOT, PROT, ALBUMIN in the last 72 hours. No results for input(s): LIPASE, AMYLASE in the last 72 hours. CBC:  Recent Labs  03/13/15 1105 03/14/15 0257  WBC 7.5 6.3  NEUTROABS 5.5  --   HGB 12.5 12.4  HCT 38.4 37.5  MCV 98.5 97.9  PLT 147* 147*    ASSESSMENT AND PLAN:   1. Acute Diastolic CHF:  Clinically improving Will discharge with lasix 40mg  BID x 3 days then 40mg  daily KDur 20 meq daily Will arrange transition of care appointment with cardiology She is elderly and frail and at risk for decompensation/ rehospitalization  2. Exertional chest tightness: 2 wks h/o exertional chest tightness assoc with dyspnea. Currently symptom free. follow-up with Dr Meda Coffee.  Given advanced age, a conservative approach is best.  3. Malignant HTN with CHF: BP is improving with diuresis/ medical management Will need  very close outpatient care  4. CKD III: Creat stable with diuresis.  5. HL: Cont statin.  6. Persistent AFib: Rate controlled chads2vasc score is 5.  On coumadin.  Will need INR follow-up through her living facility.  She is instructed to have an INR check on Wednesday  7. Hypokalemia Replete today Kdur 20mg  daily at discharge Needs BMET with transition of care cardiology appointment  Anticipate discharge to home today            Thompson Grayer, MD 03/16/2015 10:23 AM

## 2015-03-16 NOTE — Progress Notes (Addendum)
Physician Discharge Summary  Tammy Vaughn [id number] DOB: 1924-10-15 DOA: 03/13/2015  PCP: Maylon Peppers, MD  Admit date: 03/13/2015 Discharge date: 03/16/2015  Time spent: 25* minutes  Recommendations for Outpatient Follow-up:  1. Follow up cardiology in 2 weeks   Discharge Diagnoses:  Principal Problem:   Acute diastolic CHF (congestive heart failure) (HCC) Active Problems:   Persistent atrial fibrillation (HCC)   Hyperlipemia   CKD (chronic kidney disease), stage III   Pulmonary hypertension (HCC)   Essential hypertension   Acute pulmonary edema ([place])   Discharge Condition: Stable  Diet recommendation: Low salt diet  Filed Weights   03/14/15 0302 03/15/15 0513 03/16/15 0512  Weight: 67.586 kg (149 lb) 66 kg (145 lb 8.1 oz) 65.046 kg (143 lb 6.4 oz)    History of present illness:  81 y.o. female  SOB. Gradual onset 3 days ago. Constant. gettign worse. Nothing makes symptoms better. Associated w/ LE swelling that is constant and getting worse.. No home O2. Patient unsure she has gained or lost weight recently. Patient is unsure of her home medication regimen as she states that her son placed the medications out and she takes them. No recent medication changes  Hospital Course:  1. Hypotension-improved after holding antihypertensive medications. Will continue to hold Catapres, lisinopril has been restarted continue Coreg.  2. Acute on chronic diastolic CHF-  Improved, back to baseline,patient was started on IV Lasix 40 mg every 12 hours, she has diuresed well at this time Lasix has been changed to 40 mg by mouth twice a day x 3 days then lasix 40 mg po daily. 3. Atrial fibrillation- heart rate controlled, continue Coreg. Patient on Coumadin. 4. Depression- continue Remeron 5. Chronic pain syndrome- continue Vicodin, Celebrex 6. Hypokalemia- today potassium is 3.2, will give 2 doses of  Kdur 40 meq by mouth 2 every 4 hours. And send home on potassium 20 meq by mouth  daily  Procedures:  None   Consultations:  Cardiology   Discharge Exam: Filed Vitals:   03/16/15 0512 03/16/15 0818  BP: 153/85 150/84  Pulse: 76 72  Temp: 97.8 F (36.6 C)   Resp: 18     General: Appears in no acute distress Cardiovascular: S1S2 RRR Respiratory: Clear bilaterally  Discharge Instructions   Discharge Instructions    Diet - low sodium heart healthy    Complete by:  As directed      Discharge instructions    Complete by:  As directed   Lasix 40 mg po twice daily x 3 days then Lasix 40 mg po daily     Increase activity slowly    Complete by:  As directed           Current Discharge Medication List    START taking these medications   Details  furosemide (LASIX) 40 MG tablet Take 1 tablet (40 mg total) by mouth daily. Qty: 30 tablet, Refills: 2      CONTINUE these medications which have CHANGED   Details  carvedilol (COREG) 12.5 MG tablet Take 1 tablet (12.5 mg total) by mouth 2 (two) times daily with a meal. Qty: 30 tablet, Refills: 2    lisinopril (PRINIVIL,ZESTRIL) 10 MG tablet Take 1 tablet (10 mg total) by mouth daily. Qty: 30 tablet, Refills: 2    potassium chloride (K-DUR,KLOR-CON) 10 MEQ tablet Take 2 tablets (20 mEq total) by mouth daily. Qty: 30 tablet, Refills: 1    sennosides-docusate sodium (SENOKOT-S) 8.6-50 MG tablet Take 1 tablet by mouth daily. Qty:  30 tablet, Refills: 1    simvastatin (ZOCOR) 20 MG tablet Take 1 tablet (20 mg total) by mouth daily at 6 PM. Qty: 30 tablet, Refills: 0      CONTINUE these medications which have NOT CHANGED   Details  mirtazapine (REMERON) 7.5 MG tablet Take 7.5 mg by mouth at bedtime.    Vitamin D, Ergocalciferol, (DRISDOL) 50000 UNITS CAPS capsule Take 50,000 Units by mouth every 7 (seven) days.    warfarin (COUMADIN) 4 MG tablet Take 4 mg by mouth daily.    ALPRAZolam (XANAX) 0.25 MG tablet Take 1 tablet (0.25 mg total) by mouth at bedtime as needed for anxiety or sleep. Qty: 10  tablet, Refills: 0    diltiazem (CARDIZEM) 60 MG tablet Take 1 tablet (60 mg total) by mouth as needed. For atrial fibrillation Qty: 30 tablet, Refills: 3   Associated Diagnoses: Chest pain, unspecified chest pain type; Atrial fibrillation, unspecified    HYDROcodone-acetaminophen (NORCO/VICODIN) 5-325 MG per tablet Take 1 tablet by mouth every 6 (six) hours as needed for moderate pain. Qty: 20 tablet, Refills: 0    isosorbide mononitrate (IMDUR) 30 MG 24 hr tablet Take 1 tablet (30 mg total) by mouth daily. Qty: 90 tablet, Refills: 3    ondansetron (ZOFRAN ODT) 4 MG disintegrating tablet Take 1 tablet (4 mg total) by mouth every 8 (eight) hours as needed for nausea or vomiting. Qty: 12 tablet, Refills: 1      STOP taking these medications     cloNIDine (CATAPRES) 0.3 MG tablet      lisinopril-hydrochlorothiazide (PRINZIDE,ZESTORETIC) 20-12.5 MG per tablet      acetaminophen (TYLENOL) 325 MG tablet      polyethylene glycol (MIRALAX / GLYCOLAX) packet        Allergies  Allergen Reactions  . Codeine   . Morphine And Related   . Amlodipine Other (See Comments)      The results of significant diagnostics from this hospitalization (including imaging, microbiology, ancillary and laboratory) are listed below for reference.    Significant Diagnostic Studies: Dg Chest 2 View  03/13/2015  CLINICAL DATA:  Cough, shortness of breath. EXAM: CHEST  2 VIEW COMPARISON:  October 08, 2014. FINDINGS: Stable cardiomegaly is noted. Mild central pulmonary vascular congestion is noted. No pneumothorax is noted. Old lower thoracic compression fracture is noted. Increased bibasilar opacities are noted concerning for subsegmental atelectasis or edema with minimal associated pleural effusions. IMPRESSION: Stable cardiomegaly with mild central pulmonary vascular congestion. Mild bibasilar opacities are noted concerning for subsegmental atelectasis or edema with minimal associated pleural effusions.  Electronically Signed   By: Marijo Conception, M.D.   On: 03/13/2015 11:33    Microbiology: No results found for this or any previous visit (from the past 240 hour(s)).   Labs: Basic Metabolic Panel:  Recent Labs Lab 03/13/15 1105 03/14/15 0257 03/15/15 0454 03/16/15 0322  NA 140 141 142 141  K 3.5 3.3* 3.5 3.2*  CL 106 102 102 102  CO2 28 30 32 30  GLUCOSE 146* 101* 103* 109*  BUN 22* 17 18 19   CREATININE 0.92 1.06* 1.13* 1.08*  CALCIUM 9.2 9.2 9.1 9.2  MG 2.1  --   --   --    Liver Function Tests: No results for input(s): AST, ALT, ALKPHOS, BILITOT, PROT, ALBUMIN in the last 168 hours. No results for input(s): LIPASE, AMYLASE in the last 168 hours. No results for input(s): AMMONIA in the last 168 hours. CBC:  Recent Labs Lab 03/13/15  1105 03/14/15 0257  WBC 7.5 6.3  NEUTROABS 5.5  --   HGB 12.5 12.4  HCT 38.4 37.5  MCV 98.5 97.9  PLT 147* 147*   Cardiac Enzymes:  Recent Labs Lab 03/13/15 1105  TROPONINI <0.03   BNP: BNP (last 3 results)  Recent Labs  03/13/15 1105  BNP 466.6*    ProBNP (last 3 results)  Recent Labs  03/24/14 0845  PROBNP 3197.0*    CBG: No results for input(s): GLUCAP in the last 168 hours.     SignedEleonore Chiquito S  [facility] 03/16/2015, 12:25 PM

## 2015-03-16 NOTE — Progress Notes (Signed)
PHARMACY NOTE - Follow Up Consult  Pharmacy Consult  :  Coumadin Indication  :  atrial fibrillation   Recent Labs  03/14/15 0257 03/15/15 0454 03/16/15 0322  HGB 12.4  --   --   HCT 37.5  --   --   PLT 147*  --   --   LABPROT 20.4* 21.8* 23.9*  INR 1.75* 1.91* 2.16*  CREATININE 1.06* 1.13* 1.08*   Medications: Scheduled:  . carvedilol  12.5 mg Oral BID WC  . furosemide  40 mg Oral BID  . isosorbide mononitrate  30 mg Oral Daily  . lisinopril  10 mg Oral Daily  . mirtazapine  7.5 mg Oral QHS  . polyethylene glycol  17 g Oral Daily  . potassium chloride  40 mEq Oral Q4H  . senna-docusate  1 tablet Oral Daily  . simvastatin  20 mg Oral q1800  . sodium chloride  3 mL Intravenous Q12H  . warfarin  4 mg Oral ONCE-1800  . Warfarin - Pharmacist Dosing Inpatient   Does not apply q1800   Assessment:  81 y/o female on chronic Coumadin for atrial fibrillation.    Patient is to be discharged home today.  INR 2.16, within therapeutic range.  Goal:  Target INR 2-3   Plan: 1. Resume Coumadin 4 mg po daily.  Will order dose today if patient's discharge is delayed.   Joann Jorge, Craig Guess,  Pharm.D  03/16/2015, 1:03 PM

## 2015-03-18 ENCOUNTER — Telehealth: Payer: Self-pay | Admitting: Cardiology

## 2015-03-18 DIAGNOSIS — N183 Chronic kidney disease, stage 3 unspecified: Secondary | ICD-10-CM | POA: Insufficient documentation

## 2015-03-18 NOTE — Telephone Encounter (Signed)
New message    Son Truman Hayward calling wants to know if Dr. Meda Coffee know of any PCP that she can referral patient to.

## 2015-03-18 NOTE — Telephone Encounter (Signed)
Notified the son and informed him that typically we refer to [facility].  Informed the son there are several different [place] offices in the area, and the closest to his location for the pt is in [place] on [address] at [phone].  Informed the son that Dr Meda Coffee likes the whole group there.  Informed the son he can call and request a new patient appt for the pt.  Son verbalized understanding and gracious for all the assistance provided.

## 2015-03-19 ENCOUNTER — Telehealth: Payer: Self-pay | Admitting: Cardiology

## 2015-03-19 NOTE — Telephone Encounter (Signed)
Patient contacted regarding discharge from [facility] on 03/16/2015.  Patient understands to follow up with provider Dr. Meda Coffee on 03/28/15 at 64 at [facility] location. Patient understands discharge instructions? yes Patient understands medications and regiment? Yes  Patient understands to bring all medications to this visit? yes  Pt states she is feeling much better since being discharged from the hospital.  Pt has no further questions or concerns.

## 2015-03-19 NOTE — Telephone Encounter (Signed)
TOC pt-appt with Meda Coffee 03-28-15-previously scheduled

## 2015-03-28 ENCOUNTER — Ambulatory Visit (INDEPENDENT_AMBULATORY_CARE_PROVIDER_SITE_OTHER): Payer: Medicare Other | Admitting: Cardiology

## 2015-03-28 ENCOUNTER — Encounter: Payer: Self-pay | Admitting: Cardiology

## 2015-03-28 VITALS — BP 150/70 | HR 73 | Ht 66.0 in | Wt 147.0 lb

## 2015-03-28 DIAGNOSIS — I481 Persistent atrial fibrillation: Secondary | ICD-10-CM

## 2015-03-28 DIAGNOSIS — I5033 Acute on chronic diastolic (congestive) heart failure: Secondary | ICD-10-CM | POA: Diagnosis not present

## 2015-03-28 DIAGNOSIS — R072 Precordial pain: Secondary | ICD-10-CM

## 2015-03-28 DIAGNOSIS — I5031 Acute diastolic (congestive) heart failure: Secondary | ICD-10-CM

## 2015-03-28 DIAGNOSIS — I4819 Other persistent atrial fibrillation: Secondary | ICD-10-CM

## 2015-03-28 DIAGNOSIS — I110 Hypertensive heart disease with heart failure: Secondary | ICD-10-CM | POA: Diagnosis not present

## 2015-03-28 DIAGNOSIS — E785 Hyperlipidemia, unspecified: Secondary | ICD-10-CM

## 2015-03-28 LAB — BASIC METABOLIC PANEL
BUN: 19 mg/dL (ref 7–25)
CO2: 30 mmol/L (ref 20–31)
Calcium: 9.6 mg/dL (ref 8.6–10.4)
Chloride: 101 mmol/L (ref 98–110)
Creat: 1.02 mg/dL — ABNORMAL HIGH (ref 0.60–0.88)
Glucose, Bld: 84 mg/dL (ref 65–99)
Potassium: 3.9 mmol/L (ref 3.5–5.3)
Sodium: 141 mmol/L (ref 135–146)

## 2015-03-28 MED ORDER — FUROSEMIDE 40 MG PO TABS: 40.0000 mg | ORAL_TABLET | Freq: Every day | ORAL | Status: DC

## 2015-03-28 NOTE — Patient Instructions (Addendum)
Your physician has recommended you make the following change in your medication:   TAKE LASIX 40 MG ONCE DAILY.  YOU MAY ALSO TAKE AN ADDITIONAL LASIX 40 MG IN THE AFTERNOONS (2 PM), ONLY WITH WEIGHT GAIN OF 3 LBS IN A 24 HOUR PERIOD OR 5 LBS IN ONE WEEK   ---DR Meda Coffee WOULD LIKE FOR YOU TO WEIGH YOURSELF EVERY MORNING AFTER URINATION AND LOG THIS INFORMATION DOWN---YOU WILL BASE TAKING YOUR LASIX WITH WHAT YOUR DAILY WEIGHT IS   Your physician recommends that you return for lab work in: TODAY---BMET    Your physician recommends that you schedule a follow-up appointment in:   DR NELSON IN 3 MONTHS

## 2015-03-28 NOTE — Progress Notes (Signed)
Patient ID: Tammy Vaughn, female   DOB: February 18, 1925, 81 y.o.   MRN: [id number]      Cardiology Office Note   Date:  03/28/2015   ID:  Tammy Vaughn, DOB [birth year], MRN [id number]  PCP:  Maylon Peppers, MD  Cardiologist:  Dorothy Spark, MD   Follow up post hospitalization for CHF    History of Present Illness: 81 y/o female with a h/o persistent AF, HTN, CKD III, and pulmonary hypertension. She is on chronic coumadin with INRs followed at her senior living community. She was previously very active, walking often without significant limitations but in late June, she fell and suffered a thoracic vertebrae burst fx. Since her fall, she feels as though she never really got back to her prior level of activity. Nothing specific, just slow to recover. About 2 wks ago, she began to note exertional dyspnea associated with mild chest tightness. Ss last a few mins and resolve with rest. She has never had either dyspnea or chest tightness in the absence of exertion. She denies palpitations. She does not weigh herself @ home. Over the past 2 wks, dyspnea and chest tightness have been occurring with less and less provocation. Over the past 3-4 days, she has also noted increasing lower ext edema. She denies pnd, orthopnea, change in abd girth (though she says she always has a firm abdomen), or early satiety. She has been compliant with her meds (someone at the senior community prepares her meds in a pill counter, two weeks at a time). Today, after talking with her dtr in law, her family decided that she should be evaluated. She presented to the [facility] where she was markedly hypertensive (196/100) and mildly hypoxic @ 89% on RA. BNP was mildly elevated while cxr showed vasc congestion. She was treated with IV lasix with some improvement in Ss and Tx to [facility] for further eval. She was diuresed, her BP meds were adjusted and discharged home euvolemic at 143 lbs.  03/28/15 - 2 weeks follow up, she is  complaint with her meds and feels very well, denies chest pain, has stable SOB, no LE edema, no palpitations, syncope. She doesn't have a scale but is planning to buy one.    Past Medical History  Diagnosis Date  . Arthritis   . Essential hypertension   . Hx of right knee surgery   . Persistent atrial fibrillation ([place])     a. 04/2014 Echo: EF 55-60%, mild LVH, mild to mod AI/MR, sev dil LA, mildly reduced RV fxn, sev dil RA, mild-mod TR, PASP 8mmHg.  . High cholesterol   . Osteoarthritis   . Hyperlipemia   . Burst fracture of thoracic vertebra (HCC)     a. stable burst fracture t9-t10  . CKD (chronic kidney disease), stage III     a. Hypertensive CKD.  . Diverticulosis of large intestine   . Iron deficiency   . Pulmonary hypertension ([place])     a. 04/2014 Echo: PASP 11mmHg.  . CHF (congestive heart failure) ([place])   . Shortness of breath dyspnea   . History of skin cancer     Past Surgical History  Procedure Laterality Date  . Hip surgery Bilateral   . Abdominal hysterectomy    . Cesarean section    . Total knee arthroplasty Right 06/2011     Current Outpatient Prescriptions  Medication Sig Dispense Refill  . ALPRAZolam (XANAX) 0.25 MG tablet Take 1 tablet (0.25 mg total) by mouth at bedtime as needed for  anxiety or sleep. 10 tablet 0  . carvedilol (COREG) 12.5 MG tablet Take 1 tablet (12.5 mg total) by mouth 2 (two) times daily with a meal. 30 tablet 2  . diltiazem (CARDIZEM) 60 MG tablet Take 1 tablet (60 mg total) by mouth as needed. For atrial fibrillation (Patient not taking: Reported on 03/13/2015) 30 tablet 3  . furosemide (LASIX) 40 MG tablet Take 1 tablet (40 mg total) by mouth daily. 30 tablet 2  . HYDROcodone-acetaminophen (NORCO/VICODIN) 5-325 MG per tablet Take 1 tablet by mouth every 6 (six) hours as needed for moderate pain. (Patient not taking: Reported on 03/13/2015) 20 tablet 0  . isosorbide mononitrate (IMDUR) 30 MG 24 hr tablet Take 1 tablet (30 mg total)  by mouth daily. (Patient not taking: Reported on 03/13/2015) 90 tablet 3  . lisinopril (PRINIVIL,ZESTRIL) 10 MG tablet Take 1 tablet (10 mg total) by mouth daily. 30 tablet 2  . mirtazapine (REMERON) 7.5 MG tablet Take 7.5 mg by mouth at bedtime.    . ondansetron (ZOFRAN ODT) 4 MG disintegrating tablet Take 1 tablet (4 mg total) by mouth every 8 (eight) hours as needed for nausea or vomiting. (Patient not taking: Reported on 03/13/2015) 12 tablet 1  . potassium chloride (K-DUR,KLOR-CON) 10 MEQ tablet Take 2 tablets (20 mEq total) by mouth daily. 30 tablet 1  . sennosides-docusate sodium (SENOKOT-S) 8.6-50 MG tablet Take 1 tablet by mouth daily. 30 tablet 1  . simvastatin (ZOCOR) 20 MG tablet Take 1 tablet (20 mg total) by mouth daily at 6 PM. 30 tablet 0  . Vitamin D, Ergocalciferol, (DRISDOL) 50000 UNITS CAPS capsule Take 50,000 Units by mouth every 7 (seven) days.    Marland Kitchen warfarin (COUMADIN) 4 MG tablet Take 4 mg by mouth daily.     No current facility-administered medications for this visit.    Allergies:   Codeine; Morphine and related; and Amlodipine    Social History:  The patient  reports that she has never smoked. She has never used smokeless tobacco. She reports that she does not drink alcohol or use illicit drugs.   Family History:  The patient's family history includes Hypertension in her brother; Stroke in her mother. There is no history of Heart attack.    ROS:  Please see the history of present illness.   Otherwise, review of systems are positive for none.   All other systems are reviewed and negative.    PHYSICAL EXAM: VS:  BP 150/70 mmHg  Pulse 73  Ht 5\' 6"  (1.676 m)  Wt 147 lb (66.679 kg)  BMI 23.74 kg/m2 , BMI Body mass index is 23.74 kg/(m^2). GEN: Well nourished, well developed, in no acute distress HEENT: normal Neck: no JVD, carotid bruits, or masses Cardiac: RRR; no murmurs, rubs, or gallops,no edema  Respiratory:  clear to auscultation bilaterally, normal work  of breathing GI: soft, nontender, nondistended, + BS MS: no deformity or atrophy Skin: warm and dry, no rash Neuro:  Strength and sensation are intact Psych: euthymic mood, full affect  EKG:  EKG is ordered today. The ekg ordered today demonstrates atrial fibrillation, 90 BPM  Recent Labs: 03/13/2015: B Natriuretic Peptide 466.6*; Magnesium 2.1 03/14/2015: Hemoglobin 12.4; Platelets 147* 03/16/2015: BUN 19; Creatinine, Ser 1.08*; Potassium 3.2*; Sodium 141   Lipid Panel No results found for: CHOL, TRIG, HDL, CHOLHDL, VLDL, LDLCALC, LDLDIRECT   Wt Readings from Last 3 Encounters:  03/28/15 147 lb (66.679 kg)  03/16/15 143 lb 6.4 oz (65.046 kg)  12/29/14 162  lb (73.483 kg)      ASSESSMENT AND PLAN:  1. Acute Diastolic CHF:  Clinically improving, weight 147, on discharge 2 weeks ago 143, appears euvolemic, she is advised to buy a scale and check her weights daily, use extra lasix 40 mg po in the early afternoon if weight 3 lbs overnight or 5 lbs in a week.  2. Exertional chest tightness: 2 wks h/o exertional chest tightness assoc with dyspnea. Currently symptom free. follow-up with Dr Meda Coffee.  Given advanced age, a conservative approach is best.  3. Malignant HTN with CHF: BP is improving with diuresis/ medical management Will need very close outpatient care  4. CKD III: Creat stable with diuresis.  5. HL: Cont statin.  6. Persistent AFib: Rate controlled chads2vasc score is 5.  On coumadin.  Will need INR follow-up through her living facility.  She is instructed to have an INR check on Wednesday  7. Hypokalemia - check BMP today.  Follow up in 3 months.    Signed, Dorothy Spark, MD  03/28/2015 12:10 PM    [facility] [place], [place], [place]  [id number] Phone: [phone]; Fax: [phone]

## 2015-04-10 NOTE — Discharge Summary (Signed)
Physician Discharge Summary  Tammy Vaughn [id number] DOB: 03/30/1925 DOA: 03/13/2015  PCP: Maylon Peppers, MD  Admit date: 03/13/2015 Discharge date: 03/16/2015  Time spent: 25* minutes  Recommendations for Outpatient Follow-up:  1. Follow up cardiology in 2 weeks   Discharge Diagnoses:  Principal Problem:  Acute diastolic CHF (congestive heart failure) (HCC) Active Problems:  Persistent atrial fibrillation (HCC)  Hyperlipemia  CKD (chronic kidney disease), stage III  Pulmonary hypertension (HCC)  Essential hypertension  Acute pulmonary edema ([place])   Discharge Condition: Stable  Diet recommendation: Low salt diet  Filed Weights   03/14/15 0302 03/15/15 0513 03/16/15 0512  Weight: 67.586 kg (149 lb) 66 kg (145 lb 8.1 oz) 65.046 kg (143 lb 6.4 oz)    History of present illness:  81 y.o. female  SOB. Gradual onset 3 days ago. Constant. gettign worse. Nothing makes symptoms better. Associated w/ LE swelling that is constant and getting worse.. No home O2. Patient unsure she has gained or lost weight recently. Patient is unsure of her home medication regimen as she states that her son placed the medications out and she takes them. No recent medication changes  Hospital Course:  1. Hypotension-improved after holding antihypertensive medications. Will continue to hold Catapres, lisinopril has been restarted continue Coreg.  2. Acute on chronic diastolic CHF- Improved, back to baseline,patient was started on IV Lasix 40 mg every 12 hours, she has diuresed well at this time Lasix has been changed to 40 mg by mouth twice a day x 3 days then lasix 40 mg po daily. 3. Atrial fibrillation- heart rate controlled, continue Coreg. Patient on Coumadin. 4. Depression- continue Remeron 5. Chronic pain syndrome- continue Vicodin, Celebrex 6. Hypokalemia- today potassium is 3.2, will give 2 doses of Kdur 40 meq by mouth 2 every 4 hours. And send home on potassium  20 meq by mouth daily  Procedures:  None  Consultations:  Cardiology  Discharge Exam: Filed Vitals:   03/16/15 0512 03/16/15 0818  BP: 153/85 150/84  Pulse: 76 72  Temp: 97.8 F (36.6 C)   Resp: 18     General: Appears in no acute distress Cardiovascular: S1S2 RRR Respiratory: Clear bilaterally  Discharge Instructions   Discharge Instructions    Diet - low sodium heart healthy  Complete by: As directed      Discharge instructions  Complete by: As directed   Lasix 40 mg po twice daily x 3 days then Lasix 40 mg po daily     Increase activity slowly  Complete by: As directed           Current Discharge Medication List    START taking these medications   Details  furosemide (LASIX) 40 MG tablet Take 1 tablet (40 mg total) by mouth daily. Qty: 30 tablet, Refills: 2      CONTINUE these medications which have CHANGED   Details  carvedilol (COREG) 12.5 MG tablet Take 1 tablet (12.5 mg total) by mouth 2 (two) times daily with a meal. Qty: 30 tablet, Refills: 2    lisinopril (PRINIVIL,ZESTRIL) 10 MG tablet Take 1 tablet (10 mg total) by mouth daily. Qty: 30 tablet, Refills: 2    potassium chloride (K-DUR,KLOR-CON) 10 MEQ tablet Take 2 tablets (20 mEq total) by mouth daily. Qty: 30 tablet, Refills: 1    sennosides-docusate sodium (SENOKOT-S) 8.6-50 MG tablet Take 1 tablet by mouth daily. Qty: 30 tablet, Refills: 1    simvastatin (ZOCOR) 20 MG tablet Take 1 tablet (20 mg total) by mouth  daily at 6 PM. Qty: 30 tablet, Refills: 0      CONTINUE these medications which have NOT CHANGED   Details  mirtazapine (REMERON) 7.5 MG tablet Take 7.5 mg by mouth at bedtime.    Vitamin D, Ergocalciferol, (DRISDOL) 50000 UNITS CAPS capsule Take 50,000 Units by mouth every 7 (seven) days.    warfarin (COUMADIN) 4 MG tablet Take 4 mg by mouth daily.    ALPRAZolam (XANAX) 0.25 MG tablet  Take 1 tablet (0.25 mg total) by mouth at bedtime as needed for anxiety or sleep. Qty: 10 tablet, Refills: 0    diltiazem (CARDIZEM) 60 MG tablet Take 1 tablet (60 mg total) by mouth as needed. For atrial fibrillation Qty: 30 tablet, Refills: 3   Associated Diagnoses: Chest pain, unspecified chest pain type; Atrial fibrillation, unspecified    HYDROcodone-acetaminophen (NORCO/VICODIN) 5-325 MG per tablet Take 1 tablet by mouth every 6 (six) hours as needed for moderate pain. Qty: 20 tablet, Refills: 0    isosorbide mononitrate (IMDUR) 30 MG 24 hr tablet Take 1 tablet (30 mg total) by mouth daily. Qty: 90 tablet, Refills: 3    ondansetron (ZOFRAN ODT) 4 MG disintegrating tablet Take 1 tablet (4 mg total) by mouth every 8 (eight) hours as needed for nausea or vomiting. Qty: 12 tablet, Refills: 1      STOP taking these medications     cloNIDine (CATAPRES) 0.3 MG tablet      lisinopril-hydrochlorothiazide (PRINZIDE,ZESTORETIC) 20-12.5 MG per tablet      acetaminophen (TYLENOL) 325 MG tablet      polyethylene glycol (MIRALAX / GLYCOLAX) packet        Allergies  Allergen Reactions  . Codeine   . Morphine And Related   . Amlodipine Other (See Comments)       The results of significant diagnostics from this hospitalization (including imaging, microbiology, ancillary and laboratory) are listed below for reference.    Significant Diagnostic Studies:  Imaging Results    Dg Chest 2 View  03/13/2015 CLINICAL DATA: Cough, shortness of breath. EXAM: CHEST 2 VIEW COMPARISON: October 08, 2014. FINDINGS: Stable cardiomegaly is noted. Mild central pulmonary vascular congestion is noted. No pneumothorax is noted. Old lower thoracic compression fracture is noted. Increased bibasilar opacities are noted concerning for subsegmental atelectasis or edema with minimal associated pleural effusions. IMPRESSION: Stable cardiomegaly with mild central  pulmonary vascular congestion. Mild bibasilar opacities are noted concerning for subsegmental atelectasis or edema with minimal associated pleural effusions. Electronically Signed By: Marijo Conception, M.D. On: 03/13/2015 11:33     Microbiology: No results found for this or any previous visit (from the past 240 hour(s)).   Labs: Basic Metabolic Panel:  Last Labs      Recent Labs Lab 03/13/15 1105 03/14/15 0257 03/15/15 0454 03/16/15 0322  NA 140 141 142 141  K 3.5 3.3* 3.5 3.2*  CL 106 102 102 102  CO2 28 30 32 30  GLUCOSE 146* 101* 103* 109*  BUN 22* 17 18 19   CREATININE 0.92 1.06* 1.13* 1.08*  CALCIUM 9.2 9.2 9.1 9.2  MG 2.1 --  --  --      Liver Function Tests:  Last Labs     No results for input(s): AST, ALT, ALKPHOS, BILITOT, PROT, ALBUMIN in the last 168 hours.    Last Labs     No results for input(s): LIPASE, AMYLASE in the last 168 hours.    Last Labs     No results for input(s): AMMONIA in  the last 168 hours.   CBC:  Last Labs      Recent Labs Lab 03/13/15 1105 03/14/15 0257  WBC 7.5 6.3  NEUTROABS 5.5 --   HGB 12.5 12.4  HCT 38.4 37.5  MCV 98.5 97.9  PLT 147* 147*     Cardiac Enzymes:  Last Labs      Recent Labs Lab 03/13/15 1105  TROPONINI <0.03     BNP: BNP (last 3 results)  Recent Labs (within last 365 days)     Recent Labs  03/13/15 1105  BNP 466.6*      ProBNP (last 3 results)  Recent Labs (within last 365 days)     Recent Labs  03/24/14 0845  PROBNP 3197.0*      CBG:  Last Labs     No results for input(s): GLUCAP in the last 168 hours.       SignedEleonore Chiquito S [facility] 03/16/2015, 12:25 PM

## 2015-04-18 DIAGNOSIS — Z9181 History of falling: Secondary | ICD-10-CM | POA: Insufficient documentation

## 2015-06-05 DIAGNOSIS — R0981 Nasal congestion: Secondary | ICD-10-CM | POA: Insufficient documentation

## 2015-06-12 DIAGNOSIS — J301 Allergic rhinitis due to pollen: Secondary | ICD-10-CM | POA: Insufficient documentation

## 2015-07-04 ENCOUNTER — Encounter: Payer: Self-pay | Admitting: Cardiology

## 2015-07-04 ENCOUNTER — Ambulatory Visit (INDEPENDENT_AMBULATORY_CARE_PROVIDER_SITE_OTHER): Payer: Medicare Other | Admitting: Cardiology

## 2015-07-04 VITALS — BP 148/76 | HR 64 | Ht 66.0 in | Wt 158.0 lb

## 2015-07-04 DIAGNOSIS — I10 Essential (primary) hypertension: Secondary | ICD-10-CM | POA: Diagnosis not present

## 2015-07-04 DIAGNOSIS — I5033 Acute on chronic diastolic (congestive) heart failure: Secondary | ICD-10-CM | POA: Diagnosis not present

## 2015-07-04 DIAGNOSIS — I5031 Acute diastolic (congestive) heart failure: Secondary | ICD-10-CM

## 2015-07-04 DIAGNOSIS — E785 Hyperlipidemia, unspecified: Secondary | ICD-10-CM | POA: Diagnosis not present

## 2015-07-04 LAB — COMPREHENSIVE METABOLIC PANEL
ALT: 25 U/L (ref 6–29)
AST: 32 U/L (ref 10–35)
Albumin: 4 g/dL (ref 3.6–5.1)
Alkaline Phosphatase: 102 U/L (ref 33–130)
BUN: 23 mg/dL (ref 7–25)
CO2: 29 mmol/L (ref 20–31)
Calcium: 9.3 mg/dL (ref 8.6–10.4)
Chloride: 102 mmol/L (ref 98–110)
Creat: 0.94 mg/dL — ABNORMAL HIGH (ref 0.60–0.88)
Glucose, Bld: 87 mg/dL (ref 65–99)
Potassium: 3.8 mmol/L (ref 3.5–5.3)
Sodium: 142 mmol/L (ref 135–146)
Total Bilirubin: 0.9 mg/dL (ref 0.2–1.2)
Total Protein: 7.5 g/dL (ref 6.1–8.1)

## 2015-07-04 NOTE — Patient Instructions (Signed)
Medication Instructions:   Your physician recommends that you continue on your current medications as directed. Please refer to the Current Medication list given to you today.   Labwork:  TODAY--CMET AND BNP   Follow-Up:  3 MONTHS WITH DR Meda Coffee     If you need a refill on your cardiac medications before your next appointment, please call your pharmacy.

## 2015-07-04 NOTE — Progress Notes (Signed)
Patient ID: Tammy Vaughn, female   DOB: 1924-07-08, 82 y.o.   MRN: [id number]      Cardiology Office Note   Date:  07/04/2015   ID:  Tammy Vaughn, DOB 1925-03-27, MRN [id number]  PCP:  No PCP Per Patient  Cardiologist:  Dorothy Spark, MD   Follow up post hospitalization for CHF    History of Present Illness: 82 y/o female with a h/o persistent AF, HTN, CKD III, and pulmonary hypertension. She is on chronic coumadin with INRs followed at her senior living community. She was previously very active, walking often without significant limitations but in late June, she fell and suffered a thoracic vertebrae burst fx. Since her fall, she feels as though she never really got back to her prior level of activity. Nothing specific, just slow to recover. About 2 wks ago, she began to note exertional dyspnea associated with mild chest tightness. Ss last a few mins and resolve with rest. She has never had either dyspnea or chest tightness in the absence of exertion. She denies palpitations. She does not weigh herself @ home. Over the past 2 wks, dyspnea and chest tightness have been occurring with less and less provocation. Over the past 3-4 days, she has also noted increasing lower ext edema. She denies pnd, orthopnea, change in abd girth (though she says she always has a firm abdomen), or early satiety. She has been compliant with her meds (someone at the senior community prepares her meds in a pill counter, two weeks at a time). Today, after talking with her dtr in law, her family decided that she should be evaluated. She presented to the [facility] where she was markedly hypertensive (196/100) and mildly hypoxic @ 89% on RA. BNP was mildly elevated while cxr showed vasc congestion. She was treated with IV lasix with some improvement in Ss and Tx to [facility] for further eval. She was diuresed, her BP meds were adjusted and discharged home euvolemic at 143 lbs.  07/04/2015 - 3 months follow-up, the  patient feels very well she denies any chest pain shortness of breath palpitations or syncope. She has no lower extremity edema she has been checking her weight and its in low 150s. She sleeps very well and overall has no complaints at all.   Past Medical History  Diagnosis Date  . Arthritis   . Essential hypertension   . Hx of right knee surgery   . Persistent atrial fibrillation ([place])     a. 04/2014 Echo: EF 55-60%, mild LVH, mild to mod AI/MR, sev dil LA, mildly reduced RV fxn, sev dil RA, mild-mod TR, PASP 4mmHg.  . High cholesterol   . Osteoarthritis   . Hyperlipemia   . Burst fracture of thoracic vertebra (HCC)     a. stable burst fracture t9-t10  . CKD (chronic kidney disease), stage III     a. Hypertensive CKD.  . Diverticulosis of large intestine   . Iron deficiency   . Pulmonary hypertension ([place])     a. 04/2014 Echo: PASP 38mmHg.  . CHF (congestive heart failure) ([place])   . Shortness of breath dyspnea   . History of skin cancer    Past Surgical History  Procedure Laterality Date  . Hip surgery Bilateral   . Abdominal hysterectomy    . Cesarean section    . Total knee arthroplasty Right 06/2011   Current Outpatient Prescriptions  Medication Sig Dispense Refill  . donepezil (ARICEPT) 5 MG tablet Take 1 tablet by mouth daily.  5  . ALPRAZolam (XANAX) 0.25 MG tablet Take 1 tablet (0.25 mg total) by mouth at bedtime as needed for anxiety or sleep. 10 tablet 0  . carvedilol (COREG) 12.5 MG tablet Take 1 tablet (12.5 mg total) by mouth 2 (two) times daily with a meal. 30 tablet 2  . diltiazem (CARDIZEM) 60 MG tablet Take 1 tablet (60 mg total) by mouth as needed. For atrial fibrillation (Patient not taking: Reported on 03/13/2015) 30 tablet 3  . furosemide (LASIX) 40 MG tablet Take 1 tablet (40 mg total) by mouth daily. You may take an extra lasix 40 mg for weight gain of 3 lbs in a 24 hr period or 5 lbs in 1 week 90 tablet 3  . isosorbide mononitrate (IMDUR) 30 MG 24 hr tablet  Take 1 tablet (30 mg total) by mouth daily. (Patient not taking: Reported on 03/13/2015) 90 tablet 3  . lisinopril (PRINIVIL,ZESTRIL) 10 MG tablet Take 1 tablet (10 mg total) by mouth daily. 30 tablet 2  . mirtazapine (REMERON) 7.5 MG tablet Take 7.5 mg by mouth at bedtime.    . ondansetron (ZOFRAN ODT) 4 MG disintegrating tablet Take 1 tablet (4 mg total) by mouth every 8 (eight) hours as needed for nausea or vomiting. (Patient not taking: Reported on 03/13/2015) 12 tablet 1  . potassium chloride (K-DUR,KLOR-CON) 10 MEQ tablet Take 2 tablets (20 mEq total) by mouth daily. 30 tablet 1  . sennosides-docusate sodium (SENOKOT-S) 8.6-50 MG tablet Take 1 tablet by mouth daily. 30 tablet 1  . simvastatin (ZOCOR) 20 MG tablet Take 1 tablet (20 mg total) by mouth daily at 6 PM. 30 tablet 0  . warfarin (COUMADIN) 4 MG tablet Take 4 mg by mouth daily.     No current facility-administered medications for this visit.   Allergies:   Codeine; Morphine and related; and Amlodipine   Social History:  The patient  reports that she has never smoked. She has never used smokeless tobacco. She reports that she does not drink alcohol or use illicit drugs.   Family History:  The patient's family history includes Hypertension in her brother; Stroke in her mother. There is no history of Heart attack.   ROS:  Please see the history of present illness.   Otherwise, review of systems are positive for none.   All other systems are reviewed and negative.   PHYSICAL EXAM: VS:  BP 148/76 mmHg  Pulse 64  Ht 5\' 6"  (1.676 m)  Wt 158 lb (71.668 kg)  BMI 25.51 kg/m2 , BMI Body mass index is 25.51 kg/(m^2). GEN: Well nourished, well developed, in no acute distress HEENT: normal Neck: no JVD, carotid bruits, or masses Cardiac: RRR; no murmurs, rubs, or gallops,no edema  Respiratory:  clear to auscultation bilaterally, normal work of breathing GI: soft, nontender, nondistended, + BS MS: no deformity or atrophy Skin: warm and  dry, no rash Neuro:  Strength and sensation are intact Psych: euthymic mood, full affect  EKG:  EKG is ordered today. The ekg ordered today demonstrates atrial fibrillation, 90 BPM  Recent Labs: 03/13/2015: B Natriuretic Peptide 466.6*; Magnesium 2.1 03/14/2015: Hemoglobin 12.4; Platelets 147* 03/28/2015: BUN 19; Creat 1.02*; Potassium 3.9; Sodium 141   Lipid Panel No results found for: CHOL, TRIG, HDL, CHOLHDL, VLDL, LDLCALC, LDLDIRECT   Wt Readings from Last 3 Encounters:  07/04/15 158 lb (71.668 kg)  03/28/15 147 lb (66.679 kg)  03/16/15 143 lb 6.4 oz (65.046 kg)      ASSESSMENT AND PLAN:  1. Acute Diastolic CHF:  Clinically improving, weight 153, increased however patient appears euvolemic and feels great. Like to get off some medicines, will check electrolytes and creatinine today and try to eliminate potassium if possible and possibly Lasix in half to 20 mg daily.  2. Exertional chest tightness: 2 wks h/o exertional chest tightness assoc with dyspnea. Currently symptom free. follow-up with Dr Meda Coffee.  Given advanced age, a conservative approach is best.  3. Malignant HTN with CHF: She is now at goal.  4. CKD III: Creat stable with diuresis.  5. HL: Cont statin.  6. Persistent AFib: Rate controlled chads2vasc score is 5.  On coumadin.  Will need INR follow-up through her living facility.  She is instructed to have an INR check on Wednesday  7. Hypokalemia - check BMP today.  Follow up in 3 months.    Signed, Dorothy Spark, MD  07/04/2015 12:32 PM    [facility] [place], [place],   [address] Phone: [phone]; Fax: [phone]

## 2015-07-05 ENCOUNTER — Telehealth: Payer: Self-pay | Admitting: *Deleted

## 2015-07-05 LAB — BRAIN NATRIURETIC PEPTIDE: Brain Natriuretic Peptide: 230.2 pg/mL — ABNORMAL HIGH (ref <100)

## 2015-07-05 NOTE — Telephone Encounter (Signed)
Pt notified of lab results by phone with verbal understanding.  

## 2015-07-23 ENCOUNTER — Telehealth: Payer: Self-pay | Admitting: *Deleted

## 2015-07-23 NOTE — Telephone Encounter (Signed)
I spoke to patient for 3 -month follow-up phone call for Reds@ discharge Study. Patient is doing well and has not been re-hospitalized in last 3 months. i thanked patient for her participation in the study.

## 2015-08-30 ENCOUNTER — Ambulatory Visit: Payer: Medicare Other | Admitting: Family Medicine

## 2015-10-10 ENCOUNTER — Ambulatory Visit (INDEPENDENT_AMBULATORY_CARE_PROVIDER_SITE_OTHER): Payer: Medicare Other | Admitting: Cardiology

## 2015-10-10 ENCOUNTER — Encounter: Payer: Self-pay | Admitting: Cardiology

## 2015-10-10 VITALS — BP 140/74 | HR 68 | Ht 66.0 in | Wt 166.0 lb

## 2015-10-10 DIAGNOSIS — E785 Hyperlipidemia, unspecified: Secondary | ICD-10-CM | POA: Diagnosis not present

## 2015-10-10 DIAGNOSIS — I481 Persistent atrial fibrillation: Secondary | ICD-10-CM | POA: Diagnosis not present

## 2015-10-10 DIAGNOSIS — I4819 Other persistent atrial fibrillation: Secondary | ICD-10-CM

## 2015-10-10 DIAGNOSIS — G609 Hereditary and idiopathic neuropathy, unspecified: Secondary | ICD-10-CM | POA: Insufficient documentation

## 2015-10-10 DIAGNOSIS — I110 Hypertensive heart disease with heart failure: Secondary | ICD-10-CM | POA: Diagnosis not present

## 2015-10-10 DIAGNOSIS — I5033 Acute on chronic diastolic (congestive) heart failure: Secondary | ICD-10-CM | POA: Diagnosis not present

## 2015-10-10 NOTE — Progress Notes (Signed)
Patient ID: Tammy Vaughn, female   DOB: 01/05/1925, 82 y.o.   MRN: [id number]      Cardiology Office Note   Date:  10/10/2015   ID:  Tammy Vaughn, DOB 1924-07-21, MRN [id number]  PCP:  No PCP Per Patient  Cardiologist:  Ena Dawley, MD   Follow up post hospitalization for CHF    History of Present Illness: 82-y/o-female with a h/o persistent AF, HTN, CKD III, and pulmonary hypertension. She is on chronic coumadin with INRs followed at her senior living community. She was previously very active, walking often without significant limitations but in late June, she fell and suffered a thoracic vertebrae burst fx. Since her fall, she feels as though she never really got back to her prior level of activity. Nothing specific, just slow to recover. About 2 wks ago, she began to note exertional dyspnea associated with mild chest tightness. Ss last a few mins and resolve with rest. She has never had either dyspnea or chest tightness in the absence of exertion. She denies palpitations. She does not weigh herself @ home. Over the past 2 wks, dyspnea and chest tightness have been occurring with less and less provocation. Over the past 3-4 days, she has also noted increasing lower ext edema. She denies pnd, orthopnea, change in abd girth (though she says she always has a firm abdomen), or early satiety. She has been compliant with her meds (someone at the senior community prepares her meds in a pill counter, two weeks at a time). Today, after talking with her dtr in law, her family decided that she should be evaluated. She presented to the [facility] where she was markedly hypertensive (196/100) and mildly hypoxic @ 89% on RA. BNP was mildly elevated while cxr showed vasc congestion. She was treated with IV lasix with some improvement in Ss and Tx to [facility] for further eval. She was diuresed, her BP meds were adjusted and discharged home euvolemic at 143 lbs.  10/10/2015  - the patient states that  she feels great she has had problems with her neck using OxyContin however she discontinued that she feels significantly better she denies any shortness of breath or chest pain, she continues indoor in her retirement home. She has minimal on and off lower extremity edema but no orthopnea or proximal nocturnal dyspnea she doesn't feel any palpitations or denies any syncope.   Past Medical History  Diagnosis Date  . Arthritis   . Essential hypertension   . Hx of right knee surgery   . Persistent atrial fibrillation ([place])     a. 04/2014 Echo: EF 55-60%, mild LVH, mild to mod AI/MR, sev dil LA, mildly reduced RV fxn, sev dil RA, mild-mod TR, PASP 80mmHg.  . High cholesterol   . Osteoarthritis   . Hyperlipemia   . Burst fracture of thoracic vertebra (HCC)     a. stable burst fracture t9-t10  . CKD (chronic kidney disease), stage III     a. Hypertensive CKD.  . Diverticulosis of large intestine   . Iron deficiency   . Pulmonary hypertension ([place])     a. 04/2014 Echo: PASP 27mmHg.  . CHF (congestive heart failure) ([place])   . Shortness of breath dyspnea   . History of skin cancer    Past Surgical History  Procedure Laterality Date  . Hip surgery Bilateral   . Abdominal hysterectomy    . Cesarean section    . Total knee arthroplasty Right 06/2011   Current Outpatient Prescriptions  Medication  Sig Dispense Refill  . ALPRAZolam (XANAX) 0.25 MG tablet Take 1 tablet (0.25 mg total) by mouth at bedtime as needed for anxiety or sleep. 10 tablet 0  . carvedilol (COREG) 12.5 MG tablet Take 1 tablet (12.5 mg total) by mouth 2 (two) times daily with a meal. 30 tablet 2  . diltiazem (CARDIZEM) 60 MG tablet Take 1 tablet (60 mg total) by mouth as needed. For atrial fibrillation 30 tablet 3  . donepezil (ARICEPT) 5 MG tablet Take 1 tablet by mouth daily.  5  . furosemide (LASIX) 40 MG tablet Take 1 tablet (40 mg total) by mouth daily. You may take an extra lasix 40 mg for weight gain of 3 lbs in a 24 hr  period or 5 lbs in 1 week 90 tablet 3  . isosorbide mononitrate (IMDUR) 30 MG 24 hr tablet Take 1 tablet (30 mg total) by mouth daily. 90 tablet 3  . lisinopril (PRINIVIL,ZESTRIL) 10 MG tablet Take 1 tablet (10 mg total) by mouth daily. 30 tablet 2  . mirtazapine (REMERON) 7.5 MG tablet Take 7.5 mg by mouth at bedtime.    . ondansetron (ZOFRAN ODT) 4 MG disintegrating tablet Take 1 tablet (4 mg total) by mouth every 8 (eight) hours as needed for nausea or vomiting. 12 tablet 1  . potassium chloride (K-DUR,KLOR-CON) 10 MEQ tablet Take 2 tablets (20 mEq total) by mouth daily. 30 tablet 1  . sennosides-docusate sodium (SENOKOT-S) 8.6-50 MG tablet Take 1 tablet by mouth daily. 30 tablet 1  . simvastatin (ZOCOR) 20 MG tablet Take 1 tablet (20 mg total) by mouth daily at 6 PM. 30 tablet 0  . warfarin (COUMADIN) 4 MG tablet Take 4 mg by mouth daily.     No current facility-administered medications for this visit.   Allergies:   Codeine; Morphine and related; and Amlodipine   Social History:  The patient  reports that she has never smoked. She has never used smokeless tobacco. She reports that she does not drink alcohol or use illicit drugs.   Family History:  The patient's family history includes Hypertension in her brother; Stroke in her mother. There is no history of Heart attack.   ROS:  Please see the history of present illness.   Otherwise, review of systems are positive for none.   All other systems are reviewed and negative.   PHYSICAL EXAM: VS:  BP 140/74 mmHg  Pulse 68  Ht 5\' 6"  (1.676 m)  Wt 166 lb (75.297 kg)  BMI 26.81 kg/m2 , BMI Body mass index is 26.81 kg/(m^2). GEN: Well nourished, well developed, in no acute distress HEENT: normal Neck: no JVD, carotid bruits, or masses Cardiac: RRR; no murmurs, rubs, or gallops,no edema  Respiratory:  clear to auscultation bilaterally, normal work of breathing GI: soft, nontender, nondistended, + BS MS: no deformity or atrophy Skin: warm  and dry, no rash Neuro:  Strength and sensation are intact Psych: euthymic mood, full affect  EKG:  EKG is ordered today. The ekg ordered today demonstrates atrial fibrillation, 90 BPM  Recent Labs: 03/13/2015: Magnesium 2.1 03/14/2015: Hemoglobin 12.4; Platelets 147* 07/04/2015: ALT 25; Brain Natriuretic Peptide 230.2*; BUN 23; Creat 0.94*; Potassium 3.8; Sodium 142   Lipid Panel No results found for: CHOL, TRIG, HDL, CHOLHDL, VLDL, LDLCALC, LDLDIRECT   Wt Readings from Last 3 Encounters:  10/10/15 166 lb (75.297 kg)  07/04/15 158 lb (71.668 kg)  03/28/15 147 lb (66.679 kg)      ASSESSMENT AND PLAN:  1. Acute Diastolic CHF: Since the last visit her weight has improved, however she appears euvolemic and the patient states that her weight was low because of pain and inability to eat but she feels great and has no symptoms of heart failure. We will continue Lasix 40 daily.  2. Exertional chest tightness: Resolved, no ischemic workup needed at this time.  3. Essential hypertension - her blood pressures now controlled.  4. CKD III: Her creatinine has improved the last one was 0.9.  5. HL: Cont statin. No side effects.  6. Persistent AFib: Rate controlled chads2vasc score is 5.  On coumadin.  Followed by Coumadin clinic at her place, no bleeding.   Follow up in 6 months.   Signed, Ena Dawley, MD  10/10/2015 10:36 AM    [facility] [address], [place], [place]  [address] Phone: [phone]; Fax: [phone]

## 2015-10-10 NOTE — Patient Instructions (Signed)

## 2015-11-01 ENCOUNTER — Other Ambulatory Visit: Payer: Self-pay | Admitting: *Deleted

## 2015-11-01 MED ORDER — CARVEDILOL 12.5 MG PO TABS: 12.5000 mg | ORAL_TABLET | Freq: Two times a day (BID) | ORAL | Status: AC

## 2016-02-27 DIAGNOSIS — L989 Disorder of the skin and subcutaneous tissue, unspecified: Secondary | ICD-10-CM | POA: Insufficient documentation

## 2016-03-27 ENCOUNTER — Encounter: Payer: Self-pay | Admitting: Cardiology

## 2016-03-27 ENCOUNTER — Ambulatory Visit (INDEPENDENT_AMBULATORY_CARE_PROVIDER_SITE_OTHER): Payer: Medicare Other | Admitting: Cardiology

## 2016-03-27 VITALS — BP 124/80 | HR 78 | Ht 66.0 in | Wt 161.0 lb

## 2016-03-27 DIAGNOSIS — E785 Hyperlipidemia, unspecified: Secondary | ICD-10-CM

## 2016-03-27 DIAGNOSIS — R05 Cough: Secondary | ICD-10-CM

## 2016-03-27 DIAGNOSIS — I5033 Acute on chronic diastolic (congestive) heart failure: Secondary | ICD-10-CM | POA: Diagnosis not present

## 2016-03-27 DIAGNOSIS — I481 Persistent atrial fibrillation: Secondary | ICD-10-CM | POA: Diagnosis not present

## 2016-03-27 DIAGNOSIS — I10 Essential (primary) hypertension: Secondary | ICD-10-CM | POA: Diagnosis not present

## 2016-03-27 DIAGNOSIS — I4819 Other persistent atrial fibrillation: Secondary | ICD-10-CM

## 2016-03-27 DIAGNOSIS — R058 Other specified cough: Secondary | ICD-10-CM

## 2016-03-27 DIAGNOSIS — T464X5A Adverse effect of angiotensin-converting-enzyme inhibitors, initial encounter: Secondary | ICD-10-CM

## 2016-03-27 MED ORDER — LOSARTAN POTASSIUM 25 MG PO TABS: 25.0000 mg | ORAL_TABLET | Freq: Every day | ORAL | 3 refills | Status: DC

## 2016-03-27 NOTE — Addendum Note (Signed)
Addended by: Nuala Alpha on: 03/27/2016 09:39 AM   Modules accepted: Orders

## 2016-03-27 NOTE — Patient Instructions (Addendum)
Medication Instructions:   STOP TAKING LISINOPRIL  START TAKING LOSARTAN 25 MG ONCE DAILY--WILL CALL MED CHANGE INTO YOUR [facility] AT [phone]   Follow-Up:  Your physician wants you to follow-up in: [place] will receive a reminder letter in the mail two months in advance. If you don't receive a letter, please call our office to schedule the follow-up appointment.         If you need a refill on your cardiac medications before your next appointment, please call your pharmacy.

## 2016-03-27 NOTE — Progress Notes (Signed)
Patient ID: Tammy Vaughn, female   DOB: June 22, 1924, 82 y.o.   MRN: [id number]      Cardiology Office Note   Date:  03/27/2016   ID:  Tammy Vaughn, DOB Jul 27, 1924, MRN [id number]  PCP:  No PCP Per Patient  Cardiologist:  Ena Dawley, MD   Follow up post hospitalization for CHF    History of Present Illness: 82-y/o-female with a h/o persistent AF, HTN, CKD III, and pulmonary hypertension. She is on chronic coumadin with INRs followed at her senior living community. She was previously very active, walking often without significant limitations but in late June, she fell and suffered a thoracic vertebrae burst fx. Since her fall, she feels as though she never really got back to her prior level of activity. Nothing specific, just slow to recover. About 2 wks ago, she began to note exertional dyspnea associated with mild chest tightness. Ss last a few mins and resolve with rest. She has never had either dyspnea or chest tightness in the absence of exertion. She denies palpitations. She does not weigh herself @ home. Over the past 2 wks, dyspnea and chest tightness have been occurring with less and less provocation. Over the past 3-4 days, she has also noted increasing lower ext edema. She denies pnd, orthopnea, change in abd girth (though she says she always has a firm abdomen), or early satiety. She has been compliant with her meds (someone at the senior community prepares her meds in a pill counter, two weeks at a time). Today, after talking with her dtr in law, her family decided that she should be evaluated. She presented to the [facility] where she was markedly hypertensive (196/100) and mildly hypoxic @ 89% on RA. BNP was mildly elevated while cxr showed vasc congestion. She was treated with IV lasix with some improvement in Ss and Tx to [facility] for further eval. She was diuresed, her BP meds were adjusted and discharged home euvolemic at 143 lbs.  03/27/2016  - this is a six-month  follow-up, the patient states she has been doing well and is excited to celebrate her 82th birthday. She has had no chest pain or shortness of breath, no palpitations dizziness or syncope. No lower extremity edema or orthopnea. In the last 2 weeks she developed a nagging cough that is dry, but persistent, she already completed course of steroids and antibiotics with minor improvement. She denies fever or chills.   Past Medical History:  Diagnosis Date  . Arthritis   . Burst fracture of thoracic vertebra (HCC)    a. stable burst fracture t9-t10  . CHF (congestive heart failure) ([place])   . CKD (chronic kidney disease), stage III    a. Hypertensive CKD.  . Diverticulosis of large intestine   . Essential hypertension   . High cholesterol   . History of skin cancer   . Hx of right knee surgery   . Hyperlipemia   . Iron deficiency   . Osteoarthritis   . Persistent atrial fibrillation ([place])    a. 04/2014 Echo: EF 55-60%, mild LVH, mild to mod AI/MR, sev dil LA, mildly reduced RV fxn, sev dil RA, mild-mod TR, PASP 48mmHg.  Marland Kitchen Pulmonary hypertension    a. 04/2014 Echo: PASP 59mmHg.  Marland Kitchen Shortness of breath dyspnea    Past Surgical History:  Procedure Laterality Date  . ABDOMINAL HYSTERECTOMY    . CESAREAN SECTION    . HIP SURGERY Bilateral   . TOTAL KNEE ARTHROPLASTY Right 06/2011   Current Outpatient  Prescriptions  Medication Sig Dispense Refill  . ALPRAZolam (XANAX) 0.25 MG tablet Take 1 tablet (0.25 mg total) by mouth at bedtime as needed for anxiety or sleep. 10 tablet 0  . carvedilol (COREG) 12.5 MG tablet Take 1 tablet (12.5 mg total) by mouth 2 (two) times daily with a meal. 180 tablet 3  . diltiazem (CARDIZEM) 60 MG tablet Take 1 tablet (60 mg total) by mouth as needed. For atrial fibrillation 30 tablet 3  . donepezil (ARICEPT) 5 MG tablet Take 1 tablet by mouth daily.  5  . furosemide (LASIX) 40 MG tablet Take 1 tablet (40 mg total) by mouth daily. You may take an extra lasix 40 mg for  weight gain of 3 lbs in a 24 hr period or 5 lbs in 1 week 90 tablet 3  . isosorbide mononitrate (IMDUR) 30 MG 24 hr tablet Take 1 tablet (30 mg total) by mouth daily. 90 tablet 3  . lisinopril (PRINIVIL,ZESTRIL) 10 MG tablet Take 1 tablet (10 mg total) by mouth daily. 30 tablet 2  . mirtazapine (REMERON) 7.5 MG tablet Take 7.5 mg by mouth at bedtime.    . ondansetron (ZOFRAN ODT) 4 MG disintegrating tablet Take 1 tablet (4 mg total) by mouth every 8 (eight) hours as needed for nausea or vomiting. 12 tablet 1  . potassium chloride (K-DUR,KLOR-CON) 10 MEQ tablet Take 2 tablets (20 mEq total) by mouth daily. 30 tablet 1  . sennosides-docusate sodium (SENOKOT-S) 8.6-50 MG tablet Take 1 tablet by mouth daily. 30 tablet 1  . simvastatin (ZOCOR) 20 MG tablet Take 1 tablet (20 mg total) by mouth daily at 6 PM. 30 tablet 0  . warfarin (COUMADIN) 4 MG tablet Take 4 mg by mouth daily.     No current facility-administered medications for this visit.    Allergies:   Morphine and related; Tape; Amlodipine; and Codeine   Social History:  The patient  reports that she has never smoked. She has never used smokeless tobacco. She reports that she does not drink alcohol or use drugs.   Family History:  The patient's family history includes Hypertension in her brother; Stroke in her mother.   ROS:  Please see the history of present illness.   Otherwise, review of systems are positive for none.   All other systems are reviewed and negative.   PHYSICAL EXAM: VS:  BP 124/80   Pulse 78   Ht 5\' 6"  (1.676 m)   Wt 161 lb (73 kg)   BMI 25.99 kg/m  , BMI Body mass index is 25.99 kg/m. GEN: Well nourished, well developed, in no acute distress HEENT: normal Neck: no JVD, carotid bruits, or masses Cardiac: RRR; no murmurs, rubs, or gallops,no edema  Respiratory:  clear to auscultation bilaterally, normal work of breathing GI: soft, nontender, nondistended, + BS MS: no deformity or atrophy Skin: warm and dry, no  rash Neuro:  Strength and sensation are intact Psych: euthymic mood, full affect  EKG:  EKG is ordered today. The ekg ordered today demonstrates atrial fibrillation, 90 BPM  Recent Labs: 07/04/2015: ALT 25; Brain Natriuretic Peptide 230.2; BUN 23; Creat 0.94; Potassium 3.8; Sodium 142   Lipid Panel No results found for: CHOL, TRIG, HDL, CHOLHDL, VLDL, LDLCALC, LDLDIRECT   Wt Readings from Last 3 Encounters:  03/27/16 161 lb (73 kg)  10/10/15 166 lb (75.3 kg)  07/04/15 158 lb (71.7 kg)      ASSESSMENT AND PLAN:  1. Acute Diastolic CHF: She is euvolemic  we'll continue the same Lasix 40 mg by mouth daily.   2. Dry cough - no signs of fluid overload on physical exam, normal chest x-ray, switch lisinopril to losartan 25 mg by mouth daily.  3. Essential hypertension - controlled.  4. CKD III: Her creatinine has improved the last one was 0.9.  5. HL: Cont statin. No side effects.  6. Persistent AFib: Rate controlled chads2vasc score is 5.  On coumadin.  Followed by Coumadin clinic at her place, no bleeding.   Follow up in 6 months.   Signed, Ena Dawley, MD  03/27/2016 9:10 AM    [facility] [address], [place], [place]  [address] Phone: [phone]; Fax: [phone]

## 2016-03-30 ENCOUNTER — Telehealth: Payer: Self-pay | Admitting: Cardiology

## 2016-03-30 MED ORDER — LOSARTAN POTASSIUM 50 MG PO TABS: 50.0000 mg | ORAL_TABLET | Freq: Every day | ORAL | 3 refills | Status: DC

## 2016-03-30 NOTE — Telephone Encounter (Signed)
In that case just continue taking the same dose 50 mg po daily

## 2016-03-30 NOTE — Telephone Encounter (Signed)
Left a message for the clinic at [facility] to call back to endorse that med changes were called into their facility, after the pt saw Dr Meda Coffee on 12/15.  Med change was lisinopril was d/c'ed and Dr Meda Coffee started losartan 25 mg po daily.  Pt was not on losartan, prior to 12/15 med change made by Dr Meda Coffee.  Son advised that we call [facility] with med change, after she saw Dr Meda Coffee on 12/15.

## 2016-03-30 NOTE — Telephone Encounter (Signed)
Dr. Joycelyn Man office calling-her PCP-to let us know that any med changes have to be faxed to [facility] @ [phone], they manage her meds-We called PCP office to let them know we changed her Lisinopril to Losartan , they show she was already on Losartan 50mg -and if it was changing from Losartan to Lisinopril she was on Lisinopril in the past for several years-they are requesting call to get on the same page for meds--pls call [phone]

## 2016-03-30 NOTE — Telephone Encounter (Signed)
Trish RN From [facility], is calling to inform Dr Meda Coffee that this pt was already taking Losartan 50 mg po daily, and lisinopril was d/c'ed back in Sept.   Per Trish, the pt lives in an [employer].  Per Wannetta Sender, Dr Idamae Lusher with [facility] goes into the facility every week to see the Independent Residents.  Per Wannetta Sender, she noted that when the pt saw Dr Meda Coffee on last Friday 12/15, she ordered for the pt to stop taking Lisinopril and start taking Losartan 25 mg po daily.  Per Wannetta Sender, the pt has not been taking Lisinopril since Sept of this year, and was then switched to Losartan 50 mg po daily, by Dr Idamae Lusher.   Wannetta Sender states that she apologizes that a current and up-to-date med list was not sent to our office for Dr Meda Coffee to review while seeing the pt.   Wannetta Sender states that from here on out, we can see the pts current to date med list through care everywhere, through [facility] summary notes.   Wannetta Sender states that if Dr Meda Coffee orders meds in the future, this should still be sent to [facility], and then we should contact [facility] or Trish back at [phone], to make them aware of med changes.  Trish states these steps need to be done, for they have a Therapist, sports (with [employer]) who gets the pts meds for her from the pharmacy, and then prepares them weekly for her.    Wannetta Sender states that being the pt is already taking Losartan 50 mg po daily, she would like for Dr Meda Coffee to advise if she should continue this current dose, or make further changes.  Per Trish, the pt tolerates this dose appropriately.    Informed Trish that Dr Meda Coffee is currently out of the office today, but I will route this message to her for further review and recommendation if needed, and follow-up with her accordingly.  Informed Trish that I will fix med changes on our part, to match what the pt is taking through their system.   Trish RN verbalized understanding and agrees with this plan.

## 2016-03-31 NOTE — Telephone Encounter (Signed)
Spoke with Wannetta Sender at [facility], pts PCP office, to inform her that Dr Meda Coffee reviewed our conversation from yesterday, and agrees that the pt should continue with her current regimen of losartan 50 mg po daily, as is.  Informed Trish that we changed this in her med list in epic, for them to review through care everywhere.  Trish verbalized understanding and agrees with this plan. Per Wannetta Sender, she states she informed the pt and her son of this as well, and they agreed with pts current regimen.  Trish gracious for all the assistance provided.

## 2016-04-02 ENCOUNTER — Ambulatory Visit: Payer: Medicare Other | Admitting: Cardiology

## 2016-04-14 ENCOUNTER — Other Ambulatory Visit: Payer: Self-pay

## 2016-04-14 DIAGNOSIS — I5031 Acute diastolic (congestive) heart failure: Secondary | ICD-10-CM

## 2016-04-14 MED ORDER — FUROSEMIDE 40 MG PO TABS: 40.0000 mg | ORAL_TABLET | Freq: Every day | ORAL | 3 refills | Status: DC

## 2016-04-17 IMAGING — CR DG CHEST 2V
2 series · 2 of 2 positions shown · non-contrast
Comparison: October 08, 2014.

CLINICAL DATA: Cough, shortness of breath.

EXAM:
CHEST  2 VIEW

[w chest pa]
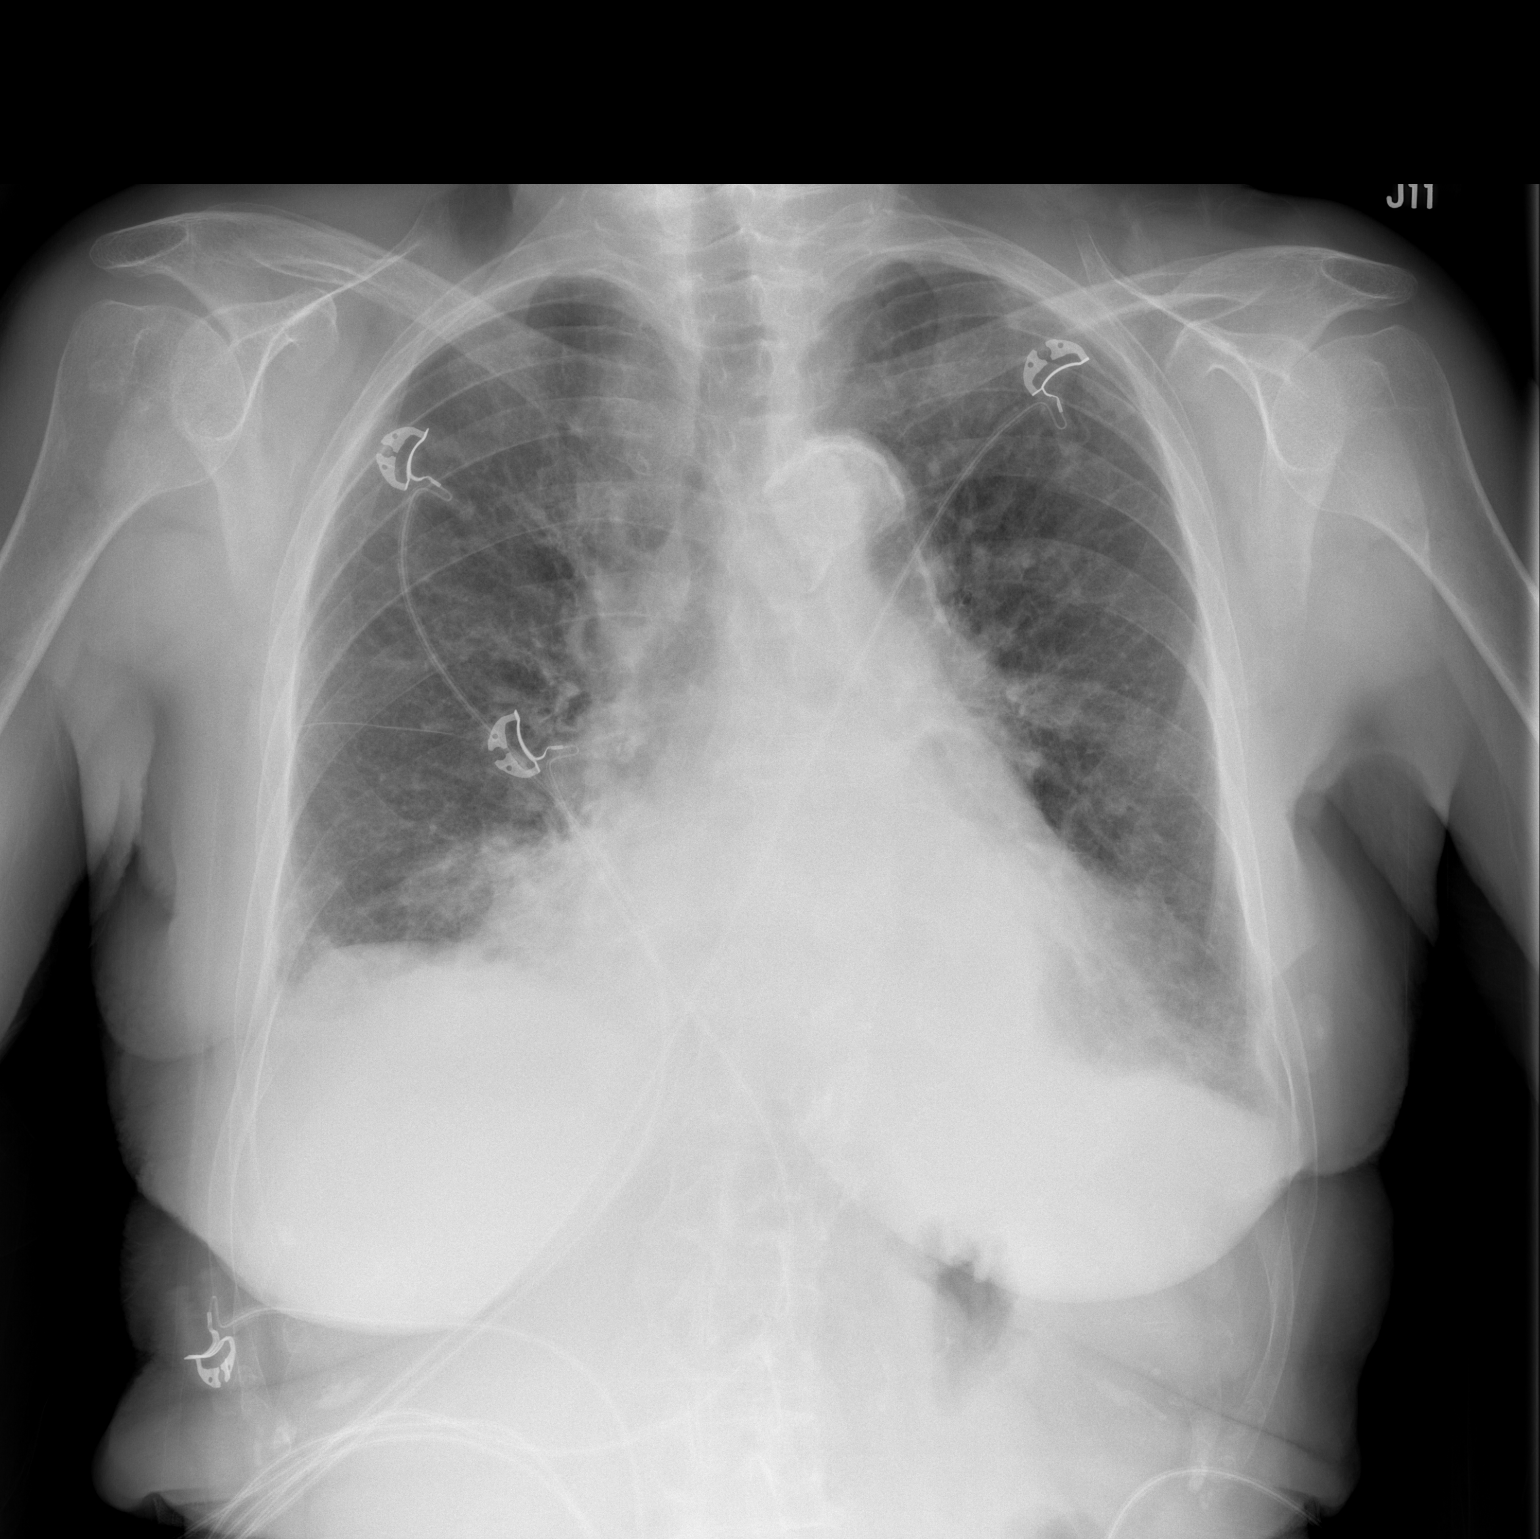

[w chest lat]
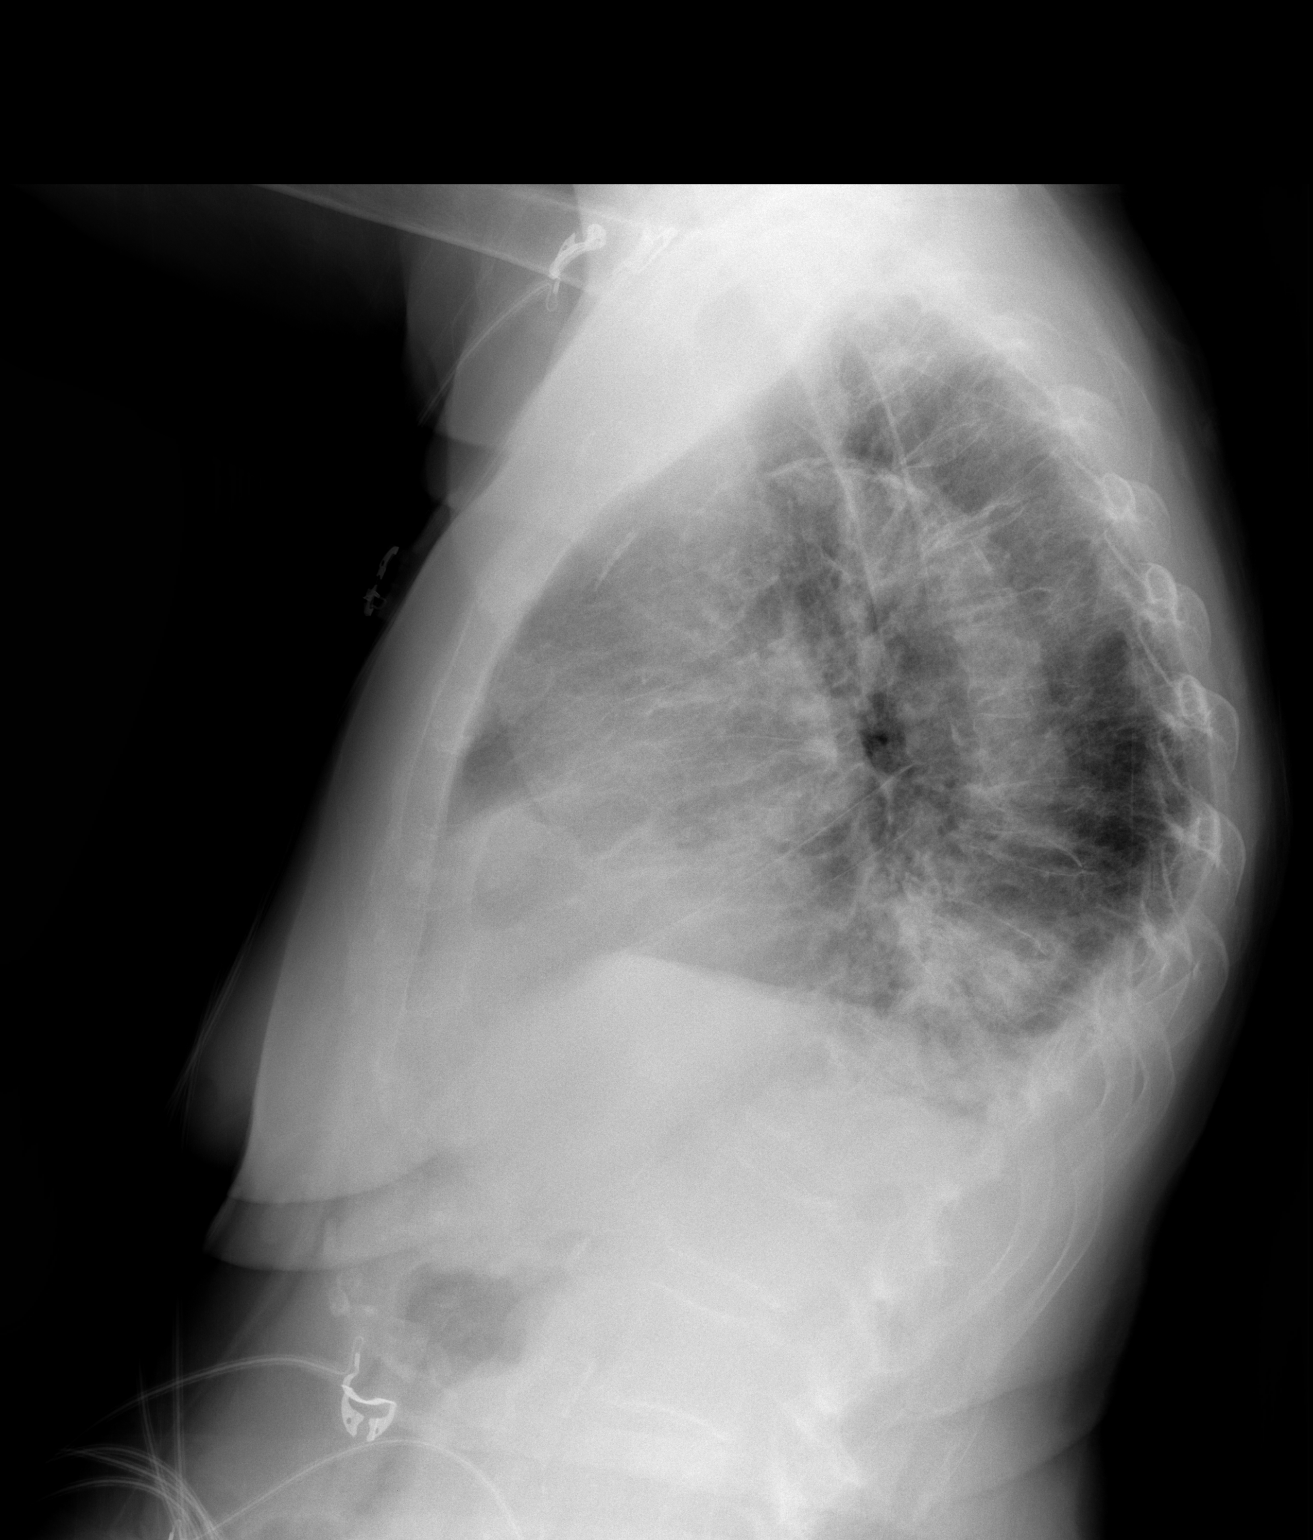

[2 of 2 positions shown; findings below may reference images not displayed]

FINDINGS: Stable cardiomegaly is noted. Mild central pulmonary vascular
congestion is noted. No pneumothorax is noted. Old lower thoracic
compression fracture is noted. Increased bibasilar opacities are
noted concerning for subsegmental atelectasis or edema with minimal
associated pleural effusions.
IMPRESSION: Stable cardiomegaly with mild central pulmonary vascular congestion.
Mild bibasilar opacities are noted concerning for subsegmental
atelectasis or edema with minimal associated pleural effusions.

## 2016-05-01 ENCOUNTER — Encounter (HOSPITAL_BASED_OUTPATIENT_CLINIC_OR_DEPARTMENT_OTHER): Payer: Self-pay | Admitting: Emergency Medicine

## 2016-05-01 ENCOUNTER — Emergency Department (HOSPITAL_BASED_OUTPATIENT_CLINIC_OR_DEPARTMENT_OTHER): Payer: Medicare Other

## 2016-05-01 ENCOUNTER — Emergency Department (HOSPITAL_BASED_OUTPATIENT_CLINIC_OR_DEPARTMENT_OTHER)
Admission: EM | Admit: 2016-05-01 | Discharge: 2016-05-01 | Disposition: A | Discharge status: Home / Self Care | Payer: Medicare Other | Attending: Emergency Medicine | Admitting: Emergency Medicine

## 2016-05-01 DIAGNOSIS — R0602 Shortness of breath: Secondary | ICD-10-CM | POA: Diagnosis present

## 2016-05-01 DIAGNOSIS — I130 Hypertensive heart and chronic kidney disease with heart failure and stage 1 through stage 4 chronic kidney disease, or unspecified chronic kidney disease: Secondary | ICD-10-CM | POA: Diagnosis not present

## 2016-05-01 DIAGNOSIS — N183 Chronic kidney disease, stage 3 (moderate): Secondary | ICD-10-CM | POA: Insufficient documentation

## 2016-05-01 DIAGNOSIS — I5033 Acute on chronic diastolic (congestive) heart failure: Secondary | ICD-10-CM | POA: Insufficient documentation

## 2016-05-01 DIAGNOSIS — I10 Essential (primary) hypertension: Secondary | ICD-10-CM

## 2016-05-01 LAB — CBC WITH DIFFERENTIAL/PLATELET
Basophils Absolute: 0 10*3/uL (ref 0.0–0.1)
Basophils Relative: 0 %
Eosinophils Absolute: 0.1 10*3/uL (ref 0.0–0.7)
Eosinophils Relative: 2 %
HCT: 42.6 % (ref 36.0–46.0)
Hemoglobin: 14 g/dL (ref 12.0–15.0)
Lymphocytes Relative: 30 %
Lymphs Abs: 1.4 10*3/uL (ref 0.7–4.0)
MCH: 31.7 pg (ref 26.0–34.0)
MCHC: 32.9 g/dL (ref 30.0–36.0)
MCV: 96.6 fL (ref 78.0–100.0)
Monocytes Absolute: 0.9 10*3/uL (ref 0.1–1.0)
Monocytes Relative: 20 %
Neutro Abs: 2.1 10*3/uL (ref 1.7–7.7)
Neutrophils Relative %: 48 %
Platelets: 149 10*3/uL — ABNORMAL LOW (ref 150–400)
RBC: 4.41 MIL/uL (ref 3.87–5.11)
RDW: 14.3 % (ref 11.5–15.5)
WBC: 4.5 10*3/uL (ref 4.0–10.5)

## 2016-05-01 LAB — BASIC METABOLIC PANEL
Anion gap: 9 (ref 5–15)
BUN: 19 mg/dL (ref 6–20)
CO2: 26 mmol/L (ref 22–32)
Calcium: 8.9 mg/dL (ref 8.9–10.3)
Chloride: 103 mmol/L (ref 101–111)
Creatinine, Ser: 0.98 mg/dL (ref 0.44–1.00)
GFR calc Af Amer: 58 mL/min — ABNORMAL LOW (ref >60)
GFR calc non Af Amer: 50 mL/min — ABNORMAL LOW (ref >60)
Glucose, Bld: 113 mg/dL — ABNORMAL HIGH (ref 65–99)
Potassium: 3.6 mmol/L (ref 3.5–5.1)
Sodium: 138 mmol/L (ref 135–145)

## 2016-05-01 LAB — BRAIN NATRIURETIC PEPTIDE: B Natriuretic Peptide: 179.3 pg/mL — ABNORMAL HIGH (ref 0.0–100.0)

## 2016-05-01 MED ORDER — FUROSEMIDE 10 MG/ML IJ SOLN
40.0000 mg | Freq: Once | INTRAMUSCULAR | Status: AC
  Administered 2016-05-01: 40 mg via INTRAVENOUS
  Filled 2016-05-01: qty 4

## 2016-05-01 NOTE — Discharge Instructions (Signed)
Please read attached information. Please contact your cardiologist and informed them of today's visit and all relevant data. Please increase her Lasix dose over the next 3 days by 20 mg. Please follow-up next week with your cardiologist or primary care for reassessment. Please return immediately if any new or worsening signs or symptoms present.

## 2016-05-01 NOTE — ED Provider Notes (Signed)
[facility] Provider Note   CSN: [id number] Arrival date & time: 05/01/16  [id number]     History   Chief Complaint Chief Complaint  Patient presents with  . Hypertension  . Shortness of Breath    HPI Tammy Vaughn is a 83 y.o. female.  HPI   83 year old female with a history of persistent A. fib, hypertension, CK 83, and pulmonary hypertension. Patient is a resident at a senior living community. Patient reports that over the last month she has developed a dry nagging nonproductive cough. She notes that she's been seen by her primary care provider and started on Mucinex and Ceftin here with no improvement in her symptoms. Patient reports that approximately one week ago she started to have shortness of breath with exertion. She notes this is only present with long walks down the hall; none at rest or with minimal exertion.  She denies any change in her cough, lower extremity swelling or edema, chest pain, orthopnea or increased abdominal girth. Patient denies any fever. Patient reports her last echo was in December 2017. Pt notes 4 lb weight gain over the last 2 weeks. BNP 230 March 2017.  Pt also notes over the last week her blood pressure has been elevated and had an appointment with her PCP today for evaluation.   Past Medical History:  Diagnosis Date  . Arthritis   . Burst fracture of thoracic vertebra (HCC)    a. stable burst fracture t9-t10  . CHF (congestive heart failure) ([place])   . CKD (chronic kidney disease), stage III    a. Hypertensive CKD.  . Diverticulosis of large intestine   . Essential hypertension   . High cholesterol   . History of skin cancer   . Hx of right knee surgery   . Hyperlipemia   . Iron deficiency   . Osteoarthritis   . Persistent atrial fibrillation ([place])    a. 04/2014 Echo: EF 55-60%, mild LVH, mild to mod AI/MR, sev dil LA, mildly reduced RV fxn, sev dil RA, mild-mod TR, PASP 42mmHg.  Marland Kitchen Pulmonary hypertension    a. 04/2014 Echo: PASP  27mmHg.  Marland Kitchen Shortness of breath dyspnea     Patient Active Problem List   Diagnosis Date Noted  . Idiopathic peripheral neuropathy 10/10/2015  . Hay fever 06/12/2015  . Congestion of nasal sinus 06/05/2015  . At risk for falling 04/18/2015  . Chronic kidney disease (CKD), stage III (moderate) 03/18/2015  . Acute pulmonary edema (HCC)   . Acute on chronic diastolic (congestive) heart failure 03/13/2015  . Acute diastolic CHF (congestive heart failure) ([place]) 03/13/2015  . Acute diastolic heart failure ([place]) 03/13/2015  . Acute on chronic diastolic heart failure ([place]) 03/13/2015  . Persistent atrial fibrillation ([place])   . Hyperlipemia   . CKD (chronic kidney disease), stage III   . Pulmonary hypertension   . Essential hypertension   . Colonic constipation 02/13/2015  . Compression fracture   . Closed burst fracture of thoracic vertebra ([place]) 01/03/2015  . Back pain, thoracic 12/24/2014  . Urge incontinence of urine 11/27/2014  . Bacterial skin infection of leg 10/11/2013  . Long term current use of anticoagulant 10/11/2013  . Phlebitis of leg, superficial 10/11/2013  . History of anticoagulant therapy 10/11/2013  . Abnormal finding on mammography 09/27/2013  . Tear of medial meniscus of knee joint 09/27/2013  . Anorexia 09/27/2013  . A-fib ([place]) 09/27/2013  . Basal cell carcinoma 09/27/2013  . Chest pain 09/27/2013  . Buedinger-Ludloff-Laewen disease  09/27/2013  . Chronic constipation 09/27/2013  . CD (contact dermatitis) 09/27/2013  . Body water dehydration 09/27/2013  . Breathing difficult 09/27/2013  . DD (diverticular disease) 09/27/2013  . Essential (primary) hypertension 09/27/2013  . Bloodgood disease 09/27/2013  . Endogenous hyperglyceridemia 09/27/2013  . Decreased potassium in the blood 09/27/2013  . Anemia, iron deficiency 09/27/2013  . Gonalgia 09/27/2013  . Degenerative joint disease involving multiple joints 09/27/2013  . Abnormal blood sugar 09/27/2013  .  OP (osteoporosis) 09/27/2013  . Intestinal infection 09/27/2013  . Arthralgia of hip or thigh 09/27/2013  . Beat, premature ventricular 09/27/2013  . Calf swelling 09/27/2013  . Cardiovascular symptoms 09/27/2013    Past Surgical History:  Procedure Laterality Date  . ABDOMINAL HYSTERECTOMY    . CESAREAN SECTION    . HIP SURGERY Bilateral   . TOTAL KNEE ARTHROPLASTY Right 06/2011    OB History    No data available       Home Medications    Prior to Admission medications   Medication Sig Start Date End Date Taking? Authorizing Provider  ALPRAZolam Duanne Moron) 0.25 MG tablet Take 1 tablet (0.25 mg total) by mouth at bedtime as needed for anxiety or sleep. 03/24/14   Tanna Furry, MD  carvedilol (COREG) 12.5 MG tablet Take 1 tablet (12.5 mg total) by mouth 2 (two) times daily with a meal. 11/01/15   Dorothy Spark, MD  diltiazem (CARDIZEM) 60 MG tablet Take 1 tablet (60 mg total) by mouth as needed. For atrial fibrillation 04/27/14   Dorothy Spark, MD  donepezil (ARICEPT) 5 MG tablet Take 1 tablet by mouth daily. 06/14/15   Historical Provider, MD  furosemide (LASIX) 40 MG tablet Take 1 tablet (40 mg total) by mouth daily. You may take an extra lasix 40 mg for weight gain of 3 lbs in a 24 hr period or 5 lbs in 1 week 04/14/16   Dorothy Spark, MD  isosorbide mononitrate (IMDUR) 30 MG 24 hr tablet Take 1 tablet (30 mg total) by mouth daily. 05/15/14   Dorothy Spark, MD  losartan (COZAAR) 50 MG tablet Take 1 tablet (50 mg total) by mouth daily. 03/30/16 06/28/16  Dorothy Spark, MD  mirtazapine (REMERON) 7.5 MG tablet Take 7.5 mg by mouth at bedtime.    Historical Provider, MD  ondansetron (ZOFRAN ODT) 4 MG disintegrating tablet Take 1 tablet (4 mg total) by mouth every 8 (eight) hours as needed for nausea or vomiting. 12/29/14   Fredia Sorrow, MD  potassium chloride (K-DUR,KLOR-CON) 10 MEQ tablet Take 2 tablets (20 mEq total) by mouth daily. 03/16/15   Oswald Hillock, MD    sennosides-docusate sodium (SENOKOT-S) 8.6-50 MG tablet Take 1 tablet by mouth daily. 03/16/15   Oswald Hillock, MD  simvastatin (ZOCOR) 20 MG tablet Take 1 tablet (20 mg total) by mouth daily at 6 PM. 03/16/15   Oswald Hillock, MD  warfarin (COUMADIN) 4 MG tablet Take 4 mg by mouth daily.    Historical Provider, MD    Family History Family History  Problem Relation Age of Onset  . Stroke Mother   . Hypertension Brother   . Heart attack Neg Hx     Social History Social History  Substance Use Topics  . Smoking status: Never Smoker  . Smokeless tobacco: Never Used  . Alcohol use No     Allergies   Morphine and related; Tape; Amlodipine; and Codeine   Review of Systems Review of Systems  All  other systems reviewed and are negative.   Physical Exam Updated Vital Signs BP 148/79 (BP Location: Left Arm)   Pulse 76   Temp 98 F (36.7 C) (Oral)   Resp 11   Ht 5\' 7"  (1.702 m)   Wt 77.1 kg   SpO2 94%   BMI 26.63 kg/m   Physical Exam  Constitutional: She is oriented to person, place, and time. She appears well-developed and well-nourished.  HENT:  Head: Normocephalic and atraumatic.  Eyes: Conjunctivae are normal. Pupils are equal, round, and reactive to light. Right eye exhibits no discharge. Left eye exhibits no discharge. No scleral icterus.  Neck: Normal range of motion. No JVD present. No tracheal deviation present.  Pulmonary/Chest: Effort normal. No stridor. No respiratory distress. She has no wheezes. She has no rales.  Abdominal: Soft. She exhibits no distension.  Musculoskeletal: She exhibits no edema.  No sig edema  Neurological: She is alert and oriented to person, place, and time. Coordination normal.  Psychiatric: She has a normal mood and affect. Her behavior is normal. Judgment and thought content normal.  Nursing note and vitals reviewed.    ED Treatments / Results  Labs (all labs ordered are listed, but only abnormal results are displayed) Labs  Reviewed  BRAIN NATRIURETIC PEPTIDE - Abnormal; Notable for the following:       Result Value   B Natriuretic Peptide 179.3 (*)    All other components within normal limits  CBC WITH DIFFERENTIAL/PLATELET - Abnormal; Notable for the following:    Platelets 149 (*)    All other components within normal limits  BASIC METABOLIC PANEL - Abnormal; Notable for the following:    Glucose, Bld 113 (*)    GFR calc non Af Amer 50 (*)    GFR calc Af Amer 58 (*)    All other components within normal limits    EKG  EKG Interpretation  Date/Time:  Friday May 01 2016 09:32:34 EST Ventricular Rate:  80 PR Interval:    QRS Duration: 89 QT Interval:  379 QTC Calculation: 438 R Axis:   97 Text Interpretation:  Atrial fibrillation Right axis deviation Confirmed by DELO  MD, DOUGLAS ([id number]) on 05/01/2016 10:06:18 AM       Radiology Dg Chest 2 View  Result Date: 05/01/2016 CLINICAL DATA:  83 year-old female w/ elevated BP for the past week and SOB and cough and congestion x 1 month. Hx of Atrial fibrillation, pulmonary hypertension, CHF EXAM: CHEST - 2 VIEW COMPARISON:  03/23/2016 FINDINGS: Mild bilateral interstitial edema, new since previous. Stable cardiomegaly.  Tortuous atheromatous thoracic aorta. No effusion. Stable mid thoracic vertebral compression deformities. IMPRESSION: 1. Cardiomegaly with new bilateral interstitial edema. Electronically Signed   By: Lucrezia Europe M.D.   On: 05/01/2016 09:36    Procedures Procedures (including critical care time)  Medications Ordered in ED Medications  furosemide (LASIX) injection 40 mg (40 mg Intravenous Given 05/01/16 1137)     Initial Impression / Assessment and Plan / ED Course  I have reviewed the triage vital signs and the nursing notes.  Pertinent labs & imaging results that were available during my care of the patient were reviewed by me and considered in my medical decision making (see chart for details).     Final Clinical  Impressions(s) / ED Diagnoses   Final diagnoses:  Hypertension, unspecified type  Shortness of breath    Labs:  BNP, CBC, BMP  Imaging: DG chest  Consults:  Therapeutics: lasix  Discharge  Meds:   Assessment/Plan:   83 year old female presents today with complaints of hypertension and shortness of breath. Patient with a history of nonproductive cough, question viral etiology in this patient. Patient has been treated with antibiotics and Mucinex with no improvement in her symptoms. Patient has a history of pulmonary hypertension, she has cardiomegaly with new bilateral interstitial edema on her chest x-ray. She has clear lung sounds, no signs of overt fluid overload other than findings of chest x-ray. Patient's BMP is 179 here and this is improved from March 2017. Patient is very well-appearing with no shortness of breath while resting or with minimal exertion. Patient was given Lasix here, her blood pressure no longer significantly hypertensive. Patient's son is at bedside, lengthy discussion of inpatient versus outpatient management. Patient feels like she is at her baseline, and has reassuring vital signs. With current influenza outbreak risk of bringing patient in the hospital for further diuresis not the appropriate option. I discussed increasing patient Lasix dose at home for the next several days with close follow-up with cardiology and primary care early next week. Both the patient and her son agreed that this would be the best option for the patient as she did not want stay in the hospital. Patient will be discharged home with instructions to increase her Lasix by 20 mg over the weekend. Family had no questions or concerns at the time discharge.     New Prescriptions Discharge Medication List as of 05/01/2016 12:31 PM       Okey Regal, PA-C 05/01/16 [place], MD 05/01/16 1558

## 2016-05-01 NOTE — ED Triage Notes (Signed)
Patient reports she is from [facility].  Reports elevated BP for the past week.  Reports this has not subsided to staff referred patient to ER.  Son with patient in ER. Patient ambulatory to room without difficulty.  Reports shortness of breath with cough and congestion x 1 month. States taking mucinex for this.

## 2016-05-13 ENCOUNTER — Encounter: Payer: Self-pay | Admitting: Physician Assistant

## 2016-05-13 NOTE — Progress Notes (Deleted)
Cardiology Office Note    Date:  05/13/2016  ID:  Tammy Vaughn, DOB 1924-04-18, MRN [id number] PCP:  No PCP Per Patient  Cardiologist:  Dr. Meda Coffee   Chief Complaint: f/u pulmonary edema  History of Present Illness:  Tammy Vaughn is a 83 y.o. female with history of persistent AF, HTN, CKD III, pulmonary hypertension, chronic diastolic CHF, hyperlipidemia.  She is on chronic coumadin with INRs followed at her senior living community. She was seen in the ED 05/01/16 with shortness of breath and congestion for 1 month as well as elevated BP. She had been treated with Mucinex and Cefdinir prior to that encounter without improvement. CXR showed new bilateral interstitial edema. She was treated with IV Lasix with improvement in the ED. Given the influenza outbreak it was felt advisable to increase outpatient Lasix dose for several days with outpatient follow-up. Labs showed Hgb 14, BNP 179 (lower than previous), K 3.6, Cr 0.98 (at baseline). Last echo 03/2015: EF 55-60%, mild-mod AI/MR, severe LAE, mildly dilated RV, mod-servere TR, mod-severe increased PASP 62.  Acute on chronic diastolic CHF Persistent atrial fib Pulmonary HTN Essential HTN CKD III    Past Medical History:  Diagnosis Date  . Aortic regurgitation   . Arthritis   . Burst fracture of thoracic vertebra (HCC)    a. stable burst fracture t9-t10  . Chronic diastolic CHF (congestive heart failure) ([place])   . CKD (chronic kidney disease), stage III    a. Hypertensive CKD.  . Diverticulosis of large intestine   . Essential hypertension   . High cholesterol   . History of skin cancer   . Hx of right knee surgery   . Iron deficiency   . Mitral regurgitation   . Osteoarthritis   . Persistent atrial fibrillation ([place])    a. 04/2014 Echo: EF 55-60%, mild LVH, mild to mod AI/MR, sev dil LA, mildly reduced RV fxn, sev dil RA, mild-mod TR, PASP 51mmHg.  Marland Kitchen Pulmonary hypertension    a. 04/2014 Echo: PASP 26mmHg.  . Tricuspid  regurgitation     Past Surgical History:  Procedure Laterality Date  . ABDOMINAL HYSTERECTOMY    . CESAREAN SECTION    . HIP SURGERY Bilateral   . TOTAL KNEE ARTHROPLASTY Right 06/2011    Current Medications: Current Outpatient Prescriptions  Medication Sig Dispense Refill  . ALPRAZolam (XANAX) 0.25 MG tablet Take 1 tablet (0.25 mg total) by mouth at bedtime as needed for anxiety or sleep. 10 tablet 0  . carvedilol (COREG) 12.5 MG tablet Take 1 tablet (12.5 mg total) by mouth 2 (two) times daily with a meal. 180 tablet 3  . diltiazem (CARDIZEM) 60 MG tablet Take 1 tablet (60 mg total) by mouth as needed. For atrial fibrillation 30 tablet 3  . donepezil (ARICEPT) 5 MG tablet Take 1 tablet by mouth daily.  5  . furosemide (LASIX) 40 MG tablet Take 1 tablet (40 mg total) by mouth daily. You may take an extra lasix 40 mg for weight gain of 3 lbs in a 24 hr period or 5 lbs in 1 week 90 tablet 3  . isosorbide mononitrate (IMDUR) 30 MG 24 hr tablet Take 1 tablet (30 mg total) by mouth daily. 90 tablet 3  . losartan (COZAAR) 50 MG tablet Take 1 tablet (50 mg total) by mouth daily. 90 tablet 3  . mirtazapine (REMERON) 7.5 MG tablet Take 7.5 mg by mouth at bedtime.    . ondansetron (ZOFRAN ODT) 4 MG disintegrating tablet  Take 1 tablet (4 mg total) by mouth every 8 (eight) hours as needed for nausea or vomiting. 12 tablet 1  . potassium chloride (K-DUR,KLOR-CON) 10 MEQ tablet Take 2 tablets (20 mEq total) by mouth daily. 30 tablet 1  . sennosides-docusate sodium (SENOKOT-S) 8.6-50 MG tablet Take 1 tablet by mouth daily. 30 tablet 1  . simvastatin (ZOCOR) 20 MG tablet Take 1 tablet (20 mg total) by mouth daily at 6 PM. 30 tablet 0  . warfarin (COUMADIN) 4 MG tablet Take 4 mg by mouth daily.     No current facility-administered medications for this visit.      Allergies:   Morphine and related; Tape; Amlodipine; and Codeine   Social History   Social History  . Marital status: Married    Spouse  name: N/A  . Number of children: N/A  . Years of education: N/A   Social History Main Topics  . Smoking status: Never Smoker  . Smokeless tobacco: Never Used  . Alcohol use No  . Drug use: No  . Sexual activity: Not on file   Other Topics Concern  . Not on file   Social History Narrative   Lives in [facility] - has a private apartment.     Family History:  The patient's family history includes Hypertension in her brother; Stroke in her mother. ***  ROS:   Please see the history of present illness. Otherwise, review of systems is positive for ***.  All other systems are reviewed and otherwise negative.    PHYSICAL EXAM:   VS:  There were no vitals taken for this visit.  BMI: There is no height or weight on file to calculate BMI. GEN: Well nourished, well developed, in no acute distress  HEENT: normocephalic, atraumatic Neck: no JVD, carotid bruits, or masses Cardiac: ***RRR; no murmurs, rubs, or gallops, no edema  Respiratory:  clear to auscultation bilaterally, normal work of breathing GI: soft, nontender, nondistended, + BS MS: no deformity or atrophy  Skin: warm and dry, no rash Neuro:  Alert and Oriented x 3, Strength and sensation are intact, follows commands Psych: euthymic mood, full affect  Wt Readings from Last 3 Encounters:  05/01/16 170 lb (77.1 kg)  03/27/16 161 lb (73 kg)  10/10/15 166 lb (75.3 kg)      Studies/Labs Reviewed:   EKG:  EKG was ordered today and personally reviewed by me and demonstrates *** EKG was not ordered today.***  Recent Labs: 07/04/2015: ALT 25 05/01/2016: B Natriuretic Peptide 179.3; BUN 19; Creatinine, Ser 0.98; Hemoglobin 14.0; Platelets 149; Potassium 3.6; Sodium 138   Lipid Panel No results found for: CHOL, TRIG, HDL, CHOLHDL, VLDL, LDLCALC, LDLDIRECT  Additional studies/ records that were reviewed today include: Summarized above.***    ASSESSMENT & PLAN:   1. ***  Disposition: F/u with  ***   Medication Adjustments/Labs and Tests Ordered: Current medicines are reviewed at length with the patient today.  Concerns regarding medicines are outlined above. Medication changes, Labs and Tests ordered today are summarized above and listed in the Patient Instructions accessible in Encounters.   Raechel Ache PA-C  05/13/2016 9:41 AM    [facility] [place], [place], [place]  [id number] Phone: [phone]; Fax: [phone]

## 2016-05-14 ENCOUNTER — Ambulatory Visit: Payer: Medicare Other | Admitting: Physician Assistant

## 2016-08-04 ENCOUNTER — Other Ambulatory Visit: Payer: Self-pay | Admitting: *Deleted

## 2016-08-04 MED ORDER — DILTIAZEM HCL 60 MG PO TABS: 60.0000 mg | ORAL_TABLET | ORAL | 6 refills | Status: DC | PRN

## 2016-09-10 DIAGNOSIS — Z5181 Encounter for therapeutic drug level monitoring: Secondary | ICD-10-CM | POA: Insufficient documentation

## 2016-09-10 DIAGNOSIS — Z7901 Long term (current) use of anticoagulants: Secondary | ICD-10-CM

## 2016-12-04 ENCOUNTER — Encounter: Payer: Self-pay | Admitting: *Deleted

## 2016-12-07 ENCOUNTER — Other Ambulatory Visit: Payer: Self-pay | Admitting: *Deleted

## 2016-12-07 ENCOUNTER — Encounter: Payer: Self-pay | Admitting: Cardiology

## 2016-12-07 ENCOUNTER — Telehealth: Payer: Self-pay | Admitting: Cardiology

## 2016-12-07 ENCOUNTER — Ambulatory Visit (INDEPENDENT_AMBULATORY_CARE_PROVIDER_SITE_OTHER): Payer: Medicare Other | Admitting: Cardiology

## 2016-12-07 VITALS — BP 110/54 | HR 62 | Ht 67.0 in | Wt 175.0 lb

## 2016-12-07 DIAGNOSIS — I481 Persistent atrial fibrillation: Secondary | ICD-10-CM

## 2016-12-07 DIAGNOSIS — I110 Hypertensive heart disease with heart failure: Secondary | ICD-10-CM

## 2016-12-07 DIAGNOSIS — I5033 Acute on chronic diastolic (congestive) heart failure: Secondary | ICD-10-CM

## 2016-12-07 DIAGNOSIS — N183 Chronic kidney disease, stage 3 unspecified: Secondary | ICD-10-CM

## 2016-12-07 DIAGNOSIS — I493 Ventricular premature depolarization: Secondary | ICD-10-CM | POA: Diagnosis not present

## 2016-12-07 DIAGNOSIS — I4819 Other persistent atrial fibrillation: Secondary | ICD-10-CM

## 2016-12-07 MED ORDER — ISOSORBIDE MONONITRATE ER 30 MG PO TB24: 15.0000 mg | ORAL_TABLET | Freq: Every day | ORAL | 3 refills | Status: DC

## 2016-12-07 MED ORDER — SIMVASTATIN 10 MG PO TABS: 10.0000 mg | ORAL_TABLET | Freq: Every day | ORAL | Status: DC

## 2016-12-07 NOTE — Telephone Encounter (Signed)
New message    Pt son is calling asking for a call back about appt pt had today with Dr. Harrington Challenger. He said pt memory is not very good and would like to ask some questions. Please call.

## 2016-12-07 NOTE — Patient Instructions (Addendum)
Your physician has recommended you make the following change in your medication:  DECREASE SIMVASTATIN TO 10 MG  EVERY DAY DECREASE ISOSORBIDE  TO 15 MG EVERY DAY Your physician recommends that you return for lab work in:  TODAY  BMET CBC TSH LIVER BNP AND Tammy Vaughn  Your physician recommends that you schedule a follow-up appointment in: [place]

## 2016-12-07 NOTE — Progress Notes (Signed)
Patient ID: Tammy Vaughn, female   DOB: 11/14/1924, 83 y.o.   MRN: [id number]      Cardiology Office Note   Date:  12/07/2016   ID:  Tammy Vaughn, DOB Feb 11, 1925, MRN [id number]  PCP:  Patient, No Pcp Per  Cardiologist:  Ena Dawley, MD   Chief complain: 6 months follow up   History of Present Illness: 89-y/o-female with a h/o persistent AF, HTN, CKD III, and pulmonary hypertension. She is on chronic coumadin with INRs followed at her senior living community. She was previously very active, walking often without significant limitations but in late June, she fell and suffered a thoracic vertebrae burst fx. Since her fall, she feels as though she never really got back to her prior level of activity. Nothing specific, just slow to recover. About 2 wks ago, she began to note exertional dyspnea associated with mild chest tightness. Ss last a few mins and resolve with rest. She has never had either dyspnea or chest tightness in the absence of exertion. She denies palpitations. She does not weigh herself @ home. Over the past 2 wks, dyspnea and chest tightness have been occurring with less and less provocation. Over the past 3-4 days, she has also noted increasing lower ext edema. She denies pnd, orthopnea, change in abd girth (though she says she always has a firm abdomen), or early satiety. She has been compliant with her meds (someone at the senior community prepares her meds in a pill counter, two weeks at a time). Today, after talking with her dtr in law, her family decided that she should be evaluated. She presented to the [facility] where she was markedly hypertensive (196/100) and mildly hypoxic @ 89% on RA. BNP was mildly elevated while cxr showed vasc congestion. She was treated with IV lasix with some improvement in Ss and Tx to [facility] for further eval. She was diuresed, her BP meds were adjusted and discharged home euvolemic at 143 lbs.  03/27/2016  - this is a six-month follow-up,  the patient states she has been doing well and is excited to celebrate her 89th birthday. She has had no chest pain or shortness of breath, no palpitations dizziness or syncope. No lower extremity edema or orthopnea. In the last 2 weeks she developed a nagging cough that is dry, but persistent, she already completed course of steroids and antibiotics with minor improvement. She denies fever or chills.  12/07/16 - 6 months follow up, the patient moved into independent living, she states that she eats ice cream sundae daily and attributes weight gain to that. She walks daily and denies SOB. Denies orthopnea or PND. She is complains with her meds and has had no falls. No bleeding.  Past Medical History:  Diagnosis Date  . Aortic regurgitation   . Arthritis   . Burst fracture of thoracic vertebra (HCC)    a. stable burst fracture t9-t10  . Chronic diastolic CHF (congestive heart failure) ([place])   . CKD (chronic kidney disease), stage III    a. Hypertensive CKD.  . Diverticulosis of large intestine   . Essential hypertension   . High cholesterol   . History of skin cancer   . Hx of right knee surgery   . Iron deficiency   . Mitral regurgitation   . Osteoarthritis   . Persistent atrial fibrillation ([place])    a. 04/2014 Echo: EF 55-60%, mild LVH, mild to mod AI/MR, sev dil LA, mildly reduced RV fxn, sev dil RA, mild-mod TR, PASP 62mmHg.  Marland Kitchen  Pulmonary hypertension ([place])    a. 04/2014 Echo: PASP 67mmHg.  . Tricuspid regurgitation    Past Surgical History:  Procedure Laterality Date  . ABDOMINAL HYSTERECTOMY    . CESAREAN SECTION    . HIP SURGERY Bilateral   . TOTAL KNEE ARTHROPLASTY Right 06/2011   Current Outpatient Prescriptions  Medication Sig Dispense Refill  . acetaminophen (TYLENOL) 325 MG tablet Take 325 mg by mouth 2 (two) times daily as needed.    . benzonatate (TESSALON) 100 MG capsule Take 100 mg by mouth 3 (three) times daily as needed for cough.    . carvedilol (COREG) 12.5 MG  tablet Take 1 tablet (12.5 mg total) by mouth 2 (two) times daily with a meal. 180 tablet 3  . diltiazem (CARDIZEM) 60 MG tablet Take 1 tablet (60 mg total) by mouth as needed. For atrial fibrillation 30 tablet 6  . furosemide (LASIX) 40 MG tablet Take 1 tablet (40 mg total) by mouth daily. You may take an extra lasix 40 mg for weight gain of 3 lbs in a 24 hr period or 5 lbs in 1 week 90 tablet 3  . ipratropium (ATROVENT) 0.03 % nasal spray Place 2 sprays into both nostrils 3 (three) times daily.    . isosorbide mononitrate (IMDUR) 30 MG 24 hr tablet Take 1 tablet (30 mg total) by mouth daily. 90 tablet 3  . mirtazapine (REMERON) 7.5 MG tablet Take 7.5 mg by mouth at bedtime.    . potassium chloride (K-DUR,KLOR-CON) 10 MEQ tablet Take 2 tablets (20 mEq total) by mouth daily. 30 tablet 1  . predniSONE (DELTASONE) 20 MG tablet Take 20 mg by mouth daily.    . sennosides-docusate sodium (SENOKOT-S) 8.6-50 MG tablet Take 1 tablet by mouth daily. 30 tablet 1  . simvastatin (ZOCOR) 20 MG tablet Take 1 tablet (20 mg total) by mouth daily at 6 PM. 30 tablet 0  . spironolactone (ALDACTONE) 25 MG tablet Take 25 mg by mouth daily.    Marland Kitchen warfarin (COUMADIN) 4 MG tablet Take 4 mg by mouth daily.    Marland Kitchen warfarin (COUMADIN) 5 MG tablet Take 5 mg by mouth as directed.    Marland Kitchen losartan (COZAAR) 50 MG tablet Take 1 tablet (50 mg total) by mouth daily. 90 tablet 3   No current facility-administered medications for this visit.    Allergies:   Morphine and related; Tape; Amlodipine; and Codeine   Social History:  The patient  reports that she has never smoked. She has never used smokeless tobacco. She reports that she does not drink alcohol or use drugs.   Family History:  The patient's family history includes Hypertension in her brother; Stroke in her mother.   ROS:  Please see the history of present illness.   Otherwise, review of systems are positive for none.   All other systems are reviewed and negative.    PHYSICAL EXAM: VS:  BP (!) 110/54   Pulse 62   Ht 5\' 7"  (1.702 m)   Wt 175 lb (79.4 kg)   SpO2 90%   BMI 27.41 kg/m  , BMI Body mass index is 27.41 kg/m. GEN: Well nourished, well developed, in no acute distress  HEENT: normal  Neck: no JVD, carotid bruits, or masses Cardiac: RRR; no murmurs, rubs, or gallops,no edema  Respiratory:  clear to auscultation bilaterally, normal work of breathing GI: soft, nontender, nondistended, + BS MS: no deformity or atrophy  Skin: warm and dry, no rash Neuro:  Strength and  sensation are intact Psych: euthymic mood, full affect  EKG:  EKG is ordered today. The ekg ordered today demonstrates atrial fibrillation, 90 BPM  Recent Labs: 05/01/2016: B Natriuretic Peptide 179.3; BUN 19; Creatinine, Ser 0.98; Hemoglobin 14.0; Platelets 149; Potassium 3.6; Sodium 138   Lipid Panel No results found for: CHOL, TRIG, HDL, CHOLHDL, VLDL, LDLCALC, LDLDIRECT   Wt Readings from Last 3 Encounters:  12/07/16 175 lb (79.4 kg)  05/01/16 170 lb (77.1 kg)  03/27/16 161 lb (73 kg)      ASSESSMENT AND PLAN:  1. Acute Diastolic CHF: She is 5 lbs up and 15 from 03/2016, she attributes it to eating sweets, we checked proBNP that was elevated at 1880, Crea 1.7, I would increase lasix to 40 mg po BID and follow up in 2 weeks with a PA and BMP and BNP recheck.   2. Dry cough - no signs of fluid overload on physical exam, normal chest x-ray, switch lisinopril to losartan 25 mg helped  3. Essential hypertension - BP rather low for her age, decrease imdur to 15 mg po daily.  4. CKD III: Crea 0.9->1.7, as above  5. HL: worsening memory, I would discontinue simvastatin.   6. Persistent AFib: Rate controlled chads2vasc score is 5.  On coumadin.  Followed by Coumadin clinic at her place, no bleeding.   Follow up in 2 weeks.  Signed, Ena Dawley, MD  12/07/2016 10:29 AM    [facility] [address], [place], [place]   [address] Phone: [phone]; Fax: [phone]

## 2016-12-07 NOTE — Telephone Encounter (Signed)
Pt's son aware of lab findings and recommendations ./cy

## 2016-12-08 ENCOUNTER — Telehealth: Payer: Self-pay | Admitting: *Deleted

## 2016-12-08 DIAGNOSIS — I5033 Acute on chronic diastolic (congestive) heart failure: Secondary | ICD-10-CM

## 2016-12-08 LAB — CBC WITH DIFFERENTIAL/PLATELET
Basophils Absolute: 0 10*3/uL (ref 0.0–0.2)
Basos: 0 %
EOS (ABSOLUTE): 0.2 10*3/uL (ref 0.0–0.4)
Eos: 3 %
Hematocrit: 41.3 % (ref 34.0–46.6)
Hemoglobin: 13.7 g/dL (ref 11.1–15.9)
Immature Grans (Abs): 0 10*3/uL (ref 0.0–0.1)
Immature Granulocytes: 0 %
Lymphocytes Absolute: 2.2 10*3/uL (ref 0.7–3.1)
Lymphs: 27 %
MCH: 31.6 pg (ref 26.6–33.0)
MCHC: 33.2 g/dL (ref 31.5–35.7)
MCV: 95 fL (ref 79–97)
Monocytes Absolute: 0.9 10*3/uL (ref 0.1–0.9)
Monocytes: 11 %
Neutrophils Absolute: 4.6 10*3/uL (ref 1.4–7.0)
Neutrophils: 59 %
Platelets: 151 10*3/uL (ref 150–379)
RBC: 4.34 x10E6/uL (ref 3.77–5.28)
RDW: 13.6 % (ref 12.3–15.4)
WBC: 8 10*3/uL (ref 3.4–10.8)

## 2016-12-08 LAB — TSH: TSH: 1.05 u[IU]/mL (ref 0.450–4.500)

## 2016-12-08 LAB — BASIC METABOLIC PANEL
BUN/Creatinine Ratio: 23 (ref 12–28)
BUN: 39 mg/dL — ABNORMAL HIGH (ref 10–36)
CO2: 23 mmol/L (ref 20–29)
Calcium: 9.4 mg/dL (ref 8.7–10.3)
Chloride: 103 mmol/L (ref 96–106)
Creatinine, Ser: 1.71 mg/dL — ABNORMAL HIGH (ref 0.57–1.00)
GFR calc Af Amer: 30 mL/min/{1.73_m2} — ABNORMAL LOW (ref >59)
GFR calc non Af Amer: 26 mL/min/{1.73_m2} — ABNORMAL LOW (ref >59)
Glucose: 88 mg/dL (ref 65–99)
Potassium: 5.3 mmol/L — ABNORMAL HIGH (ref 3.5–5.2)
Sodium: 141 mmol/L (ref 134–144)

## 2016-12-08 LAB — HEPATIC FUNCTION PANEL
ALT: 10 IU/L (ref 0–32)
AST: 16 IU/L (ref 0–40)
Albumin: 4.6 g/dL (ref 3.2–4.6)
Alkaline Phosphatase: 65 IU/L (ref 39–117)
Bilirubin Total: 0.9 mg/dL (ref 0.0–1.2)
Bilirubin, Direct: 0.23 mg/dL (ref 0.00–0.40)
Total Protein: 7.3 g/dL (ref 6.0–8.5)

## 2016-12-08 LAB — PRO B NATRIURETIC PEPTIDE: NT-Pro BNP: 1899 pg/mL — ABNORMAL HIGH (ref 0–738)

## 2016-12-08 LAB — MAGNESIUM: Magnesium: 2.3 mg/dL (ref 1.6–2.3)

## 2016-12-08 MED ORDER — FUROSEMIDE 40 MG PO TABS: 40.0000 mg | ORAL_TABLET | Freq: Two times a day (BID) | ORAL | 1 refills | Status: DC

## 2016-12-08 NOTE — Telephone Encounter (Signed)
-----   Message from Dorothy Spark, MD sent at 12/08/2016  1:46 PM EDT ----- Karlene Einstein,  For Mrs Hollerbach here are additional changes:  1. I would increase lasix to 40 mg po BID and follow up in 2 weeks with me if you could add her to some quarter day and BMP and BNP recheck at that time.  2. I would discontinue simvastatin completely.   Thank you,  KN

## 2016-12-08 NOTE — Telephone Encounter (Signed)
Spoke with the pt and Son, and informed her that per Dr Meda Coffee, from [place], she would like to add the following changes:  Informed the pt and son that she would like to increase the pts lasix to 40 mg po bid, and follow-up with her in 2 weeks, with a bmet and pro-bnp done same day.  Also informed the pt that she wants her to discontinue taking simvastatin.  Confirmed the pharmacy of choice with the pt.  Offered the pt and son to come and see Dr Meda Coffee on 9/18 at 1020. Per the pt and son, they can come in on 9/18 at 1020 to see Dr Meda Coffee.  Informed both parties that we will recheck a bmet and pro-bnp at that visit as well.  Per the Son, he would like med changes to be called to [facility], who prepares the pts medications.  Pt and son both verbalized understanding and agrees with this plan.  Spoke with Malachy Mood at [facility], and endorsed med changes to them.  Per Malachy Mood, she requested that I fax this note with included med orders from Dr Meda Coffee to [facility] at [phone].

## 2016-12-15 ENCOUNTER — Other Ambulatory Visit: Payer: Medicare Other

## 2016-12-29 ENCOUNTER — Other Ambulatory Visit: Payer: Medicare Other

## 2016-12-29 ENCOUNTER — Ambulatory Visit (INDEPENDENT_AMBULATORY_CARE_PROVIDER_SITE_OTHER): Payer: Medicare Other | Admitting: Cardiology

## 2016-12-29 ENCOUNTER — Encounter: Payer: Self-pay | Admitting: Cardiology

## 2016-12-29 VITALS — BP 124/66 | HR 76 | Ht 67.0 in | Wt 175.0 lb

## 2016-12-29 DIAGNOSIS — I481 Persistent atrial fibrillation: Secondary | ICD-10-CM

## 2016-12-29 DIAGNOSIS — I5033 Acute on chronic diastolic (congestive) heart failure: Secondary | ICD-10-CM

## 2016-12-29 DIAGNOSIS — I4819 Other persistent atrial fibrillation: Secondary | ICD-10-CM

## 2016-12-29 DIAGNOSIS — E785 Hyperlipidemia, unspecified: Secondary | ICD-10-CM | POA: Diagnosis not present

## 2016-12-29 DIAGNOSIS — I110 Hypertensive heart disease with heart failure: Secondary | ICD-10-CM | POA: Diagnosis not present

## 2016-12-29 DIAGNOSIS — I10 Essential (primary) hypertension: Secondary | ICD-10-CM | POA: Diagnosis not present

## 2016-12-29 NOTE — Progress Notes (Signed)
Patient ID: Tammy Vaughn, female   DOB: [birth year], 83 y.o.   MRN: [id number]      Cardiology Office Note   Date:  12/29/2016   ID:  Tammy Vaughn, DOB [birth year], MRN [id number]  PCP:  Patient, No Pcp Per  Cardiologist:  Tammy Dawley, MD   Chief complain: 6 months follow up   History of Present Illness: 83-y/o-female with a h/o persistent AF, HTN, CKD III, and pulmonary hypertension. She is on chronic coumadin with INRs followed at her senior living community. She was previously very active, walking often without significant limitations but in late June, she fell and suffered a thoracic vertebrae burst fx. Since her fall, she feels as though she never really got back to her prior level of activity. Nothing specific, just slow to recover. About 2 wks ago, she began to note exertional dyspnea associated with mild chest tightness. Ss last a few mins and resolve with rest. She has never had either dyspnea or chest tightness in the absence of exertion. She denies palpitations. She does not weigh herself @ home. Over the past 2 wks, dyspnea and chest tightness have been occurring with less and less provocation. Over the past 3-4 days, she has also noted increasing lower ext edema. She denies pnd, orthopnea, change in abd girth (though she says she always has a firm abdomen), or early satiety. She has been compliant with her meds (someone at the senior community prepares her meds in a pill counter, two weeks at a time). Today, after talking with her dtr in law, her family decided that she should be evaluated. She presented to the [facility] where she was markedly hypertensive (196/100) and mildly hypoxic @ 89% on RA. BNP was mildly elevated while cxr showed vasc congestion. She was treated with IV lasix with some improvement in Ss and Tx to [facility] for further eval. She was diuresed, her BP meds were adjusted and discharged home euvolemic at 143 lbs.  03/27/2016  - this is a six-month follow-up,  the patient states she has been doing well and is excited to celebrate her 89th birthday. She has had no chest pain or shortness of breath, no palpitations dizziness or syncope. No lower extremity edema or orthopnea. In the last 2 weeks she developed a nagging cough that is dry, but persistent, she already completed course of steroids and antibiotics with minor improvement. She denies fever or chills.  12/07/16 - 6 months follow up, the patient moved into independent living, she states that she eats ice cream sundae daily and attributes weight gain to that. She walks daily and denies SOB. Denies orthopnea or PND. She is complains with her meds and has had no falls. No bleeding.  12/29/2016 - 81 week follow-up, The patient states again that she feels fantastic, she has great appetite and walks around her facility without any shortness of breath or chest pain. She states that she sleeps a log, doesn't wake up for shortness of breath or needing to go to the bathroom, she denies any orthopnea or proximal nocturnal dyspnea. At the last visit I have noticed increased weight with no significant fluid overload on physical exam however proBNP was 1800 And creatinine was increased 1.7 from baseline 0.9. Lasix was increased to 40 mg by mouth twice a day. She has not is no change in symptoms since last time since she was asymptomatic at the last visit as well.   Past Medical History:  Diagnosis Date  . Aortic regurgitation   .  Arthritis   . Burst fracture of thoracic vertebra (HCC)    a. stable burst fracture t9-t10  . Chronic diastolic CHF (congestive heart failure) ([place])   . CKD (chronic kidney disease), stage III    a. Hypertensive CKD.  . Diverticulosis of large intestine   . Essential hypertension   . High cholesterol   . History of skin cancer   . Hx of right knee surgery   . Iron deficiency   . Mitral regurgitation   . Osteoarthritis   . Persistent atrial fibrillation ([place])    a. 04/2014 Echo: EF  55-60%, mild LVH, mild to mod AI/MR, sev dil LA, mildly reduced RV fxn, sev dil RA, mild-mod TR, PASP 64mmHg.  Marland Kitchen Pulmonary hypertension ([place])    a. 04/2014 Echo: PASP 42mmHg.  . Tricuspid regurgitation    Past Surgical History:  Procedure Laterality Date  . ABDOMINAL HYSTERECTOMY    . CESAREAN SECTION    . HIP SURGERY Bilateral   . TOTAL KNEE ARTHROPLASTY Right 06/2011   Current Outpatient Prescriptions  Medication Sig Dispense Refill  . acetaminophen (TYLENOL) 325 MG tablet Take 325 mg by mouth 2 (two) times daily as needed.    . benzonatate (TESSALON) 100 MG capsule Take 100 mg by mouth 3 (three) times daily as needed for cough.    . carvedilol (COREG) 12.5 MG tablet Take 1 tablet (12.5 mg total) by mouth 2 (two) times daily with a meal. 180 tablet 3  . diltiazem (CARDIZEM) 60 MG tablet Take 1 tablet (60 mg total) by mouth as needed. For atrial fibrillation 30 tablet 6  . furosemide (LASIX) 40 MG tablet Take 1 tablet (40 mg total) by mouth 2 (two) times daily. 60 tablet 1  . ipratropium (ATROVENT) 0.03 % nasal spray Place 2 sprays into both nostrils 3 (three) times daily.    . isosorbide mononitrate (IMDUR) 30 MG 24 hr tablet Take 0.5 tablets (15 mg total) by mouth daily. 90 tablet 3  . losartan (COZAAR) 50 MG tablet Take 1 tablet (50 mg total) by mouth daily. 90 tablet 3  . mirtazapine (REMERON) 7.5 MG tablet Take 7.5 mg by mouth at bedtime.    . potassium chloride (K-DUR,KLOR-CON) 10 MEQ tablet Take 2 tablets (20 mEq total) by mouth daily. 30 tablet 1  . predniSONE (DELTASONE) 20 MG tablet Take 20 mg by mouth daily.    . sennosides-docusate sodium (SENOKOT-S) 8.6-50 MG tablet Take 1 tablet by mouth daily. 30 tablet 1  . spironolactone (ALDACTONE) 25 MG tablet Take 25 mg by mouth daily.    Marland Kitchen warfarin (COUMADIN) 4 MG tablet Take 4 mg by mouth daily.    Marland Kitchen warfarin (COUMADIN) 5 MG tablet Take 5 mg by mouth as directed.     No current facility-administered medications for this visit.     Allergies:   Morphine; Morphine and related; Tape; Amlodipine; and Codeine   Social History:  The patient  reports that she has never smoked. She has never used smokeless tobacco. She reports that she does not drink alcohol or use drugs.   Family History:  The patient's family history includes Hypertension in her brother; Stroke in her mother.   ROS:  Please see the history of present illness.   Otherwise, review of systems are positive for none.   All other systems are reviewed and negative.   PHYSICAL EXAM: VS:  BP 124/66   Pulse 76   Ht 5\' 7"  (1.702 m)   Wt 175 lb (79.4  kg)   BMI 27.41 kg/m  , BMI Body mass index is 27.41 kg/m. GEN: Well nourished, well developed, in no acute distress  HEENT: normal  Neck: no JVD, carotid bruits, or masses Cardiac: RRR; no murmurs, rubs, or gallops,no edema  Respiratory:  clear to auscultation bilaterally, normal work of breathing GI: soft, nontender, nondistended, + BS MS: no deformity or atrophy  Skin: warm and dry, no rash Neuro:  Strength and sensation are intact Psych: euthymic mood, full affect  EKG:  EKG is ordered today. The ekg ordered today demonstrates atrial fibrillation, 90 BPM  Recent Labs: 05/01/2016: B Natriuretic Peptide 179.3 12/07/2016: ALT 10; BUN 39; Creatinine, Ser 1.71; Hemoglobin 13.7; Magnesium 2.3; NT-Pro BNP 1,899; Platelets 151; Potassium 5.3; Sodium 141; TSH 1.050   Lipid Panel No results found for: CHOL, TRIG, HDL, CHOLHDL, VLDL, LDLCALC, LDLDIRECT   Wt Readings from Last 3 Encounters:  12/29/16 175 lb (79.4 kg)  12/07/16 175 lb (79.4 kg)  05/01/16 170 lb (77.1 kg)      ASSESSMENT AND PLAN:  1. Acute Diastolic CHF: She was 5 lbs up and 15 from 03/2016, she attributes it to eating sweets, we checked proBNP that was elevated at 1880, Crea 1.7, we increased Lasix to 40 mg po BID,  Her weight is unchanged, she looks euvolemic and is asymptomatic.   we will recheck BMP and BNP today.  2. Dry cough -  no signs of fluid overload on physical exam, normal chest x-ray, switch lisinopril to losartan 25 mg helped  3. Essential hypertension -  well controlled on current regimen   4. CKD III: Crea 0.9->1.7, as above, Recheck BMP and BNP today.   5. HL: worsening memory, we discontinued simvastatin.   6. Persistent AFib: Rate controlled chads2vasc score is 5.  On coumadin.  Followed by Coumadin clinic at her place, no bleeding.   Follow up in 2 months.  Signed, Tammy Dawley, MD  12/29/2016 11:03 AM    [facility] [address] [address], [place], [place]  [address] Phone: [phone]; Fax: [phone]

## 2016-12-29 NOTE — Addendum Note (Signed)
Addended by: Nuala Alpha on: 12/29/2016 11:32 AM   Modules accepted: Orders

## 2016-12-29 NOTE — Patient Instructions (Addendum)
Medication Instructions:   Your physician recommends that you continue on your current medications as directed. Please refer to the Current Medication list given to you today.    Labwork:  TODAY-  BMET AND PRO-BNP      Follow-Up:  DR Meda Coffee AS SCHEDULED ON 02/24/17 AT 11 AM       If you need a refill on your cardiac medications before your next appointment, please call your pharmacy.

## 2016-12-30 LAB — BASIC METABOLIC PANEL
BUN/Creatinine Ratio: 21 (ref 12–28)
BUN: 33 mg/dL (ref 10–36)
CO2: 20 mmol/L (ref 20–29)
Calcium: 9.1 mg/dL (ref 8.7–10.3)
Chloride: 103 mmol/L (ref 96–106)
Creatinine, Ser: 1.55 mg/dL — ABNORMAL HIGH (ref 0.57–1.00)
GFR calc Af Amer: 34 mL/min/{1.73_m2} — ABNORMAL LOW (ref >59)
GFR calc non Af Amer: 29 mL/min/{1.73_m2} — ABNORMAL LOW (ref >59)
Glucose: 93 mg/dL (ref 65–99)
Potassium: 5 mmol/L (ref 3.5–5.2)
Sodium: 136 mmol/L (ref 134–144)

## 2016-12-30 LAB — PRO B NATRIURETIC PEPTIDE: NT-Pro BNP: 2730 pg/mL — ABNORMAL HIGH (ref 0–738)

## 2017-01-05 ENCOUNTER — Telehealth: Payer: Self-pay | Admitting: Cardiology

## 2017-01-05 NOTE — Telephone Encounter (Signed)
Spoke with the pts Son (on [place]) and he is calling to ask for recommendations of when to administer the pts lasix, for this is keeping her up at night. Son states he gives the pt her meds in the morning and the evening time, around dinner.  Pt is getting up and going to the bathroom frequently during hour of sleep.  Advised the son that the pts lasix should be administered at 0800 and 2 pm everyday, to avoid sleep disturbance.  Son verbalized understanding and agrees with this plan.

## 2017-01-05 NOTE — Telephone Encounter (Signed)
Tammy Vaughn is asking that you give him a call . Wants to speak to you about his mom medication ( Lasix prescription ) . Thanks

## 2017-02-24 ENCOUNTER — Ambulatory Visit (INDEPENDENT_AMBULATORY_CARE_PROVIDER_SITE_OTHER): Payer: Medicare Other | Admitting: Cardiology

## 2017-02-24 ENCOUNTER — Encounter: Payer: Self-pay | Admitting: Cardiology

## 2017-02-24 VITALS — BP 122/64 | HR 71 | Ht 67.0 in | Wt 177.0 lb

## 2017-02-24 DIAGNOSIS — E782 Mixed hyperlipidemia: Secondary | ICD-10-CM | POA: Diagnosis not present

## 2017-02-24 DIAGNOSIS — I5033 Acute on chronic diastolic (congestive) heart failure: Secondary | ICD-10-CM | POA: Diagnosis not present

## 2017-02-24 DIAGNOSIS — I10 Essential (primary) hypertension: Secondary | ICD-10-CM | POA: Diagnosis not present

## 2017-02-24 LAB — BASIC METABOLIC PANEL
BUN/Creatinine Ratio: 23 (ref 12–28)
BUN: 40 mg/dL — ABNORMAL HIGH (ref 10–36)
CO2: 24 mmol/L (ref 20–29)
Calcium: 9.4 mg/dL (ref 8.7–10.3)
Chloride: 99 mmol/L (ref 96–106)
Creatinine, Ser: 1.72 mg/dL — ABNORMAL HIGH (ref 0.57–1.00)
GFR calc Af Amer: 30 mL/min/{1.73_m2} — ABNORMAL LOW (ref >59)
GFR calc non Af Amer: 26 mL/min/{1.73_m2} — ABNORMAL LOW (ref >59)
Glucose: 101 mg/dL — ABNORMAL HIGH (ref 65–99)
Potassium: 4.9 mmol/L (ref 3.5–5.2)
Sodium: 138 mmol/L (ref 134–144)

## 2017-02-24 LAB — PRO B NATRIURETIC PEPTIDE: NT-Pro BNP: 2322 pg/mL — ABNORMAL HIGH (ref 0–738)

## 2017-02-24 NOTE — Progress Notes (Signed)
Patient ID: Tammy Vaughn, female   DOB: 23-Jul-1924, 83 y.o.   MRN: [id number]      Cardiology Office Note   Date:  02/24/2017   ID:  Tammy Vaughn, DOB 1924-09-26, MRN [id number]  PCP:  Patient, No Pcp Per  Cardiologist:  Ena Dawley, MD   Chief complain: 6 months follow up   History of Present Illness: 83-y/o-female with a h/o persistent AF, HTN, CKD III, and pulmonary hypertension. She is on chronic coumadin with INRs followed at her senior living community. She was previously very active, walking often without significant limitations but in late June, she fell and suffered a thoracic vertebrae burst fx. Since her fall, she feels as though she never really got back to her prior level of activity. Nothing specific, just slow to recover. About 2 wks ago, she began to note exertional dyspnea associated with mild chest tightness. Ss last a few mins and resolve with rest. She has never had either dyspnea or chest tightness in the absence of exertion. She denies palpitations. She does not weigh herself @ home. Over the past 2 wks, dyspnea and chest tightness have been occurring with less and less provocation. Over the past 3-4 days, she has also noted increasing lower ext edema. She denies pnd, orthopnea, change in abd girth (though she says she always has a firm abdomen), or early satiety. She has been compliant with her meds (someone at the senior community prepares her meds in a pill counter, two weeks at a time). Today, after talking with her dtr in law, her family decided that she should be evaluated. She presented to the [facility] where she was 83-y/o-female markedly hypertensive (196/100) and mildly hypoxic @ 89% on RA. BNP was mildly elevated while cxr showed vasc congestion. She was treated with IV lasix with some improvement in Ss and Tx to [facility] for further eval. She was diuresed, her BP meds were adjusted and discharged home euvolemic at 143 lbs.  12/29/2016 - 3 week follow-up, The patient  states again that she feels fantastic, she has great appetite and walks around her facility without any shortness of breath or chest pain. She states that she sleeps a log, doesn't wake up for shortness of breath or needing to go to the bathroom, she denies any orthopnea or proximal nocturnal dyspnea. At the last visit I have noticed increased weight with no significant fluid overload on physical exam however proBNP was 1800 And creatinine was increased 1.7 from baseline 0.9. Lasix was increased to 40 mg by mouth twice a day. She has not is no change in symptoms since last time since she was asymptomatic at the last visit as well.   02/24/2017 - 2 months follow up, she has been feeling well, occassional LE edema, no orthopnea or PND. No dizziness or falls. No bleeding. No palpitations.   Past Medical History:  Diagnosis Date  . Aortic regurgitation   . Arthritis   . Burst fracture of thoracic vertebra (HCC)    a. stable burst fracture t9-t10  . Chronic diastolic CHF (congestive heart failure) ([place])   . CKD (chronic kidney disease), stage III ([place])    a. Hypertensive CKD.  . Diverticulosis of large intestine   . Essential hypertension   . High cholesterol   . History of skin cancer   . Hx of right knee surgery   . Iron deficiency   . Mitral regurgitation   . Osteoarthritis   . Persistent atrial fibrillation ([place])    a. 04/2014  Echo: EF 55-60%, mild LVH, mild to mod AI/MR, sev dil LA, mildly reduced RV fxn, sev dil RA, mild-mod TR, PASP 66mmHg.  Marland Kitchen Pulmonary hypertension ([place])    a. 04/2014 Echo: PASP 77mmHg.  . Tricuspid regurgitation    Past Surgical History:  Procedure Laterality Date  . ABDOMINAL HYSTERECTOMY    . CESAREAN SECTION    . HIP SURGERY Bilateral   . TOTAL KNEE ARTHROPLASTY Right 06/2011   Current Outpatient Medications  Medication Sig Dispense Refill  . acetaminophen (TYLENOL) 325 MG tablet Take 325 mg by mouth 2 (two) times daily as needed.    . benzonatate (TESSALON)  100 MG capsule Take 100 mg by mouth 3 (three) times daily as needed for cough.    . carvedilol (COREG) 12.5 MG tablet Take 1 tablet (12.5 mg total) by mouth 2 (two) times daily with a meal. 180 tablet 3  . diltiazem (CARDIZEM) 60 MG tablet Take 1 tablet (60 mg total) by mouth as needed. For atrial fibrillation 30 tablet 6  . furosemide (LASIX) 40 MG tablet Take 1 tablet (40 mg total) by mouth 2 (two) times daily. 60 tablet 1  . ipratropium (ATROVENT) 0.03 % nasal spray Place 2 sprays into both nostrils 3 (three) times daily.    . isosorbide mononitrate (IMDUR) 30 MG 24 hr tablet Take 0.5 tablets (15 mg total) by mouth daily. 90 tablet 3  . losartan (COZAAR) 50 MG tablet Take 1 tablet (50 mg total) by mouth daily. 90 tablet 3  . mirtazapine (REMERON) 7.5 MG tablet Take 7.5 mg by mouth at bedtime.    . potassium chloride (K-DUR,KLOR-CON) 10 MEQ tablet Take 2 tablets (20 mEq total) by mouth daily. 30 tablet 1  . predniSONE (DELTASONE) 20 MG tablet Take 20 mg by mouth daily.    . sennosides-docusate sodium (SENOKOT-S) 8.6-50 MG tablet Take 1 tablet by mouth daily. 30 tablet 1  . spironolactone (ALDACTONE) 25 MG tablet Take 25 mg by mouth daily.    Marland Kitchen warfarin (COUMADIN) 4 MG tablet Take 4 mg by mouth daily.    Marland Kitchen warfarin (COUMADIN) 5 MG tablet Take 5 mg by mouth as directed.     No current facility-administered medications for this visit.    Allergies:   Morphine; Morphine and related; Tape; Amlodipine; and Codeine   Social History:  The patient  reports that  has never smoked. she has never used smokeless tobacco. She reports that she does not drink alcohol or use drugs.   Family History:  The patient's family history includes Hypertension in her brother; Stroke in her mother.   ROS:  Please see the history of present illness.   Otherwise, review of systems are positive for none.   All other systems are reviewed and negative.   PHYSICAL EXAM: VS:  BP 122/64   Pulse 71   Ht 5\' 7"  (1.702 m)    Wt 177 lb (80.3 kg)   SpO2 94%   BMI 27.72 kg/m  , BMI Body mass index is 27.72 kg/m. GEN: Well nourished, well developed, in no acute distress  HEENT: normal  Neck: no JVD, carotid bruits, or masses Cardiac: RRR; no murmurs, rubs, or gallops,no edema  Respiratory:  clear to auscultation bilaterally, normal work of breathing GI: soft, nontender, nondistended, + BS MS: no deformity or atrophy  Skin: warm and dry, no rash Neuro:  Strength and sensation are intact Psych: euthymic mood, full affect  EKG:  EKG is ordered today. The ekg ordered today  demonstrates atrial fibrillation, 90 BPM  Recent Labs: 05/01/2016: B Natriuretic Peptide 179.3 12/07/2016: ALT 10; Hemoglobin 13.7; Magnesium 2.3; Platelets 151; TSH 1.050 12/29/2016: BUN 33; Creatinine, Ser 1.55; NT-Pro BNP 2,730; Potassium 5.0; Sodium 136   Lipid Panel No results found for: CHOL, TRIG, HDL, CHOLHDL, VLDL, LDLCALC, LDLDIRECT   Wt Readings from Last 3 Encounters:  02/24/17 177 lb (80.3 kg)  12/29/16 175 lb (79.4 kg)  12/07/16 175 lb (79.4 kg)      ASSESSMENT AND PLAN:  1. Acute Diastolic CHF: stable weight however feels well, in September her Crea and BNP elevated and lasix was increased to 40 mg PO BID. She appears euvolemic, we will recheck today.  2. Essential hypertension -  well controlled on current regimen   3. CKD III: Crea 0.9->1.7-->1.5, as above, Recheck BMP and BNP today.   4. HL: worsening memory, we discontinued simvastatin.   5. Persistent AFib: Rate controlled chads2vasc score is 5.  On coumadin.  No bleeding or falls.   Follow up in 3 months.  Signed, Ena Dawley, MD  02/24/2017 11:30 AM    [facility] [place], [place], [place]  [address] Phone: [phone]; Fax: [phone]

## 2017-02-24 NOTE — Patient Instructions (Signed)
Medication Instructions:   Your physician recommends that you continue on your current medications as directed. Please refer to the Current Medication list given to you today.     Labwork:  TODAY---BMET AND PRO-BNP      Follow-Up:  3 MONTHS WITH DR Meda Coffee       If you need a refill on your cardiac medications before your next appointment, please call your pharmacy.

## 2017-02-25 ENCOUNTER — Telehealth: Payer: Self-pay | Admitting: *Deleted

## 2017-02-25 MED ORDER — FUROSEMIDE 40 MG PO TABS: 40.0000 mg | ORAL_TABLET | Freq: Every day | ORAL | 1 refills | Status: DC

## 2017-02-25 NOTE — Telephone Encounter (Signed)
Spoke with the pts Son Truman Hayward (on [place]) and endorsed the pts lab results and recommendations per Dr Meda Coffee, for the pt to decrease her lasix to 40 mg once daily, NOT BID, and be careful of to maintain a low sodium diet.  Per the pts Son he requested that I call Bernadene Bell at [facility] at [phone] and fax number [phone] to endorse new med change, for they prepare the pts labs.  Also called the pts Nursing home [facility] [phone]) and endorsed new med change to the Nurse VM in their clinic. Advised her to call back with any additional questions from the message left.  [facility] aware of change and that we will fax them a copy of the new lasix 40 mg po daily order, to their confirmed fax number, as mentioned above.  Son and all parties involved verbalized understanding and and agrees with this plan.

## 2017-02-25 NOTE — Telephone Encounter (Signed)
-----   Message from Dorothy Spark, MD sent at 02/24/2017  6:13 PM EST ----- Please advise her to decrease lasix to 40 mg po daily not BID and be careful about low sodium diet,

## 2017-05-13 ENCOUNTER — Encounter: Payer: Self-pay | Admitting: Cardiology

## 2017-05-21 ENCOUNTER — Encounter: Payer: Self-pay | Admitting: Cardiology

## 2017-05-21 ENCOUNTER — Ambulatory Visit (INDEPENDENT_AMBULATORY_CARE_PROVIDER_SITE_OTHER): Payer: Medicare Other | Admitting: Cardiology

## 2017-05-21 VITALS — BP 112/60 | HR 62 | Ht 67.0 in | Wt 175.6 lb

## 2017-05-21 DIAGNOSIS — I482 Chronic atrial fibrillation, unspecified: Secondary | ICD-10-CM

## 2017-05-21 DIAGNOSIS — Z7901 Long term (current) use of anticoagulants: Secondary | ICD-10-CM

## 2017-05-21 DIAGNOSIS — I5033 Acute on chronic diastolic (congestive) heart failure: Secondary | ICD-10-CM

## 2017-05-21 DIAGNOSIS — E782 Mixed hyperlipidemia: Secondary | ICD-10-CM

## 2017-05-21 NOTE — Patient Instructions (Addendum)
Medication Instructions:  Your provider recommends that you continue on your current medications as directed. Please refer to the Current Medication list given to you today.    Labwork: TODAY: BMET, CBC, BNP  Testing/Procedures: None  Follow-Up: Your provider recommends that you schedule a follow-up appointment in 3-4 months with Dr. Meda Coffee.  Any Other Special Instructions Will Be Listed Below (If Applicable).     If you need a refill on your cardiac medications before your next appointment, please call your pharmacy.

## 2017-05-21 NOTE — Progress Notes (Signed)
Patient ID: Tammy Vaughn, female   DOB: [birth year], 84 y.o.   MRN: [id number]      Cardiology Office Note   Date:  05/21/2017   ID:  Tammy Vaughn, DOB [birth year], MRN [id number]  PCP:  Patient, No Pcp Per  Cardiologist:  Ena Dawley, MD   Chief complain: 6 months follow up   History of Present Illness: 89-y/o-female with a h/o persistent AF, HTN, CKD III, and pulmonary hypertension. She is on chronic coumadin with INRs followed at her senior living community. She was previously very active, walking often without significant limitations but in late June, she fell and suffered a thoracic vertebrae burst fx. Since her fall, she feels as though she never really got back to her prior level of activity. Nothing specific, just slow to recover. About 2 wks ago, she began to note exertional dyspnea associated with mild chest tightness. Ss last a few mins and resolve with rest. She has never had either dyspnea or chest tightness in the absence of exertion. She denies palpitations. She does not weigh herself @ home. Over the past 2 wks, dyspnea and chest tightness have been occurring with less and less provocation. Over the past 3-4 days, she has also noted increasing lower ext edema. She denies pnd, orthopnea, change in abd girth (though she says she always has a firm abdomen), or early satiety. She has been compliant with her meds (someone at the senior community prepares her meds in a pill counter, two weeks at a time). Today, after talking with her dtr in law, her family decided that she should be evaluated. She presented to the [facility] where she was markedly hypertensive (196/100) and mildly hypoxic @ 89% on RA. BNP was mildly elevated while cxr showed vasc congestion. She was treated with IV lasix with some improvement in Ss and Tx to [facility] for further eval. She was diuresed, her BP meds were adjusted and discharged home euvolemic at 143 lbs.  12/29/2016 - 3 week follow-up, The patient  states again that she feels fantastic, she has great appetite and walks around her facility without any shortness of breath or chest pain. She states that she sleeps a log, doesn't wake up for shortness of breath or needing to go to the bathroom, she denies any orthopnea or proximal nocturnal dyspnea. At the last visit I have noticed increased weight with no significant fluid overload on physical exam however proBNP was 1800 And creatinine was increased 1.7 from baseline 0.9. Lasix was increased to 40 mg by mouth twice a day. She has not is no change in symptoms since last time since she was asymptomatic at the last visit as well.   02/24/2017 - 2 months follow up, she has been feeling well, occassional LE edema, no orthopnea or PND. No dizziness or falls. No bleeding. No palpitations.   05/21/2017 - this is 3 months follow-up, she feels and looks great, she continues to take 20 mg of Lasix and has no orthopnea paroxysmal nocturnal dyspnea and no lower extremity edema. She has been compliant with her warfarin has no bleeding in her INRs are well controlled. They're being checked weekly. She would like to get rid of Lasix if possible. She denies any chest pain shortness of breath. She continues to walk and lives in an independent living facility.  Past Medical History:  Diagnosis Date  . Aortic regurgitation   . Arthritis   . Burst fracture of thoracic vertebra (HCC)    a. stable burst fracture t9-t10  .  Chronic diastolic CHF (congestive heart failure) ([place])   . CKD (chronic kidney disease), stage III ([place])    a. Hypertensive CKD.  . Diverticulosis of large intestine   . Essential hypertension   . High cholesterol   . History of skin cancer   . Hx of right knee surgery   . Iron deficiency   . Mitral regurgitation   . Osteoarthritis   . Persistent atrial fibrillation ([place])    a. 04/2014 Echo: EF 55-60%, mild LVH, mild to mod AI/MR, sev dil LA, mildly reduced RV fxn, sev dil RA, mild-mod TR, PASP  3mmHg.  Marland Kitchen Pulmonary hypertension ([place])    a. 04/2014 Echo: PASP 72mmHg.  . Tricuspid regurgitation    Past Surgical History:  Procedure Laterality Date  . ABDOMINAL HYSTERECTOMY    . CESAREAN SECTION    . HIP SURGERY Bilateral   . TOTAL KNEE ARTHROPLASTY Right 06/2011   Current Outpatient Medications  Medication Sig Dispense Refill  . acetaminophen (TYLENOL) 325 MG tablet Take 325 mg by mouth 2 (two) times daily as needed (pain).     . carvedilol (COREG) 12.5 MG tablet Take 1 tablet (12.5 mg total) by mouth 2 (two) times daily with a meal. 180 tablet 3  . diltiazem (CARDIZEM) 60 MG tablet Take 60 mg by mouth daily as needed (a fib).    . furosemide (LASIX) 40 MG tablet Take 20 mg by mouth daily.    . isosorbide mononitrate (IMDUR) 30 MG 24 hr tablet Take 0.5 tablets (15 mg total) by mouth daily. 90 tablet 3  . losartan (COZAAR) 50 MG tablet Take 1 tablet (50 mg total) by mouth daily. 90 tablet 3  . mirtazapine (REMERON) 7.5 MG tablet Take 7.5 mg by mouth at bedtime.    . potassium chloride (K-DUR) 10 MEQ tablet Take 10 mEq by mouth daily.    Marland Kitchen senna (SENOKOT) 8.6 MG tablet Take 1 tablet by mouth daily as needed for constipation.    Marland Kitchen warfarin (COUMADIN) 4 MG tablet Take 4 mg by mouth daily.    Marland Kitchen warfarin (COUMADIN) 5 MG tablet Take 5 mg by mouth as directed.    Marland Kitchen ipratropium (ATROVENT) 0.03 % nasal spray Place 2 sprays into both nostrils 3 (three) times daily.    Marland Kitchen spironolactone (ALDACTONE) 25 MG tablet Take 25 mg by mouth daily.     No current facility-administered medications for this visit.    Allergies:   Morphine; Morphine and related; Tape; Amlodipine; and Codeine   Social History:  The patient  reports that  has never smoked. she has never used smokeless tobacco. She reports that she does not drink alcohol or use drugs.   Family History:  The patient's family history includes Hypertension in her brother; Stroke in her mother.   ROS:  Please see the history of present  illness.   Otherwise, review of systems are positive for none.   All other systems are reviewed and negative.   PHYSICAL EXAM: VS:  BP 112/60   Pulse 62   Ht 5\' 7"  (1.702 m)   Wt 175 lb 9.6 oz (79.7 kg)   BMI 27.50 kg/m  , BMI Body mass index is 27.5 kg/m. GEN: Well nourished, well developed, in no acute distress  HEENT: normal  Neck: no JVD, carotid bruits, or masses Cardiac: RRR; no murmurs, rubs, or gallops,no edema  Respiratory:  clear to auscultation bilaterally, normal work of breathing GI: soft, nontender, nondistended, + BS MS: no deformity or atrophy  Skin: warm and dry, no rash Neuro:  Strength and sensation are intact Psych: euthymic mood, full affect  EKG:  EKG is ordered today. The ekg ordered today demonstrates atrial fibrillation, 90 BPM  Recent Labs: 12/07/2016: ALT 10; Hemoglobin 13.7; Magnesium 2.3; Platelets 151; TSH 1.050 02/24/2017: BUN 40; Creatinine, Ser 1.72; NT-Pro BNP 2,322; Potassium 4.9; Sodium 138   Lipid Panel No results found for: CHOL, TRIG, HDL, CHOLHDL, VLDL, LDLCALC, LDLDIRECT   Wt Readings from Last 3 Encounters:  05/21/17 175 lb 9.6 oz (79.7 kg)  02/24/17 177 lb (80.3 kg)  12/29/16 175 lb (79.4 kg)    EKG performed today and personally reviewed, and shows atrial fibrillation with occasional PVCs unchanged from prior.    ASSESSMENT AND PLAN:  1. Acute Diastolic CHF: She looks euvolemic today, we'll check BMP and BNP and try to discontinue Lasix if possible.  2. Essential hypertension -  well controlled on current regimen   3. CKD III: Crea 0.9->1.7-->1.5- 1.7, check BMP and BNP today, hopefully we can discontinue Lasix.  4. HL: Mildly worsening memory, we discontinued simvastatin.   5. Persistent AFib: Rate controlled chads2vasc score is 5.  On coumadin.  No bleeding or falls.   Follow up in 3 months.  Signed, Ena Dawley, MD  05/21/2017 11:13 AM    [facility] [address],  [place], [place]  [address] Phone: [phone]; Fax: [phone]

## 2017-05-22 LAB — CBC WITH DIFFERENTIAL/PLATELET
Basophils Absolute: 0 10*3/uL (ref 0.0–0.2)
Basos: 0 %
EOS (ABSOLUTE): 0.1 10*3/uL (ref 0.0–0.4)
Eos: 2 %
Hematocrit: 40.4 % (ref 34.0–46.6)
Hemoglobin: 13.7 g/dL (ref 11.1–15.9)
Immature Grans (Abs): 0 10*3/uL (ref 0.0–0.1)
Immature Granulocytes: 0 %
Lymphocytes Absolute: 1.7 10*3/uL (ref 0.7–3.1)
Lymphs: 25 %
MCH: 31.4 pg (ref 26.6–33.0)
MCHC: 33.9 g/dL (ref 31.5–35.7)
MCV: 93 fL (ref 79–97)
Monocytes Absolute: 1.1 10*3/uL — ABNORMAL HIGH (ref 0.1–0.9)
Monocytes: 17 %
Neutrophils Absolute: 3.7 10*3/uL (ref 1.4–7.0)
Neutrophils: 56 %
Platelets: 150 10*3/uL (ref 150–379)
RBC: 4.36 x10E6/uL (ref 3.77–5.28)
RDW: 13.2 % (ref 12.3–15.4)
WBC: 6.6 10*3/uL (ref 3.4–10.8)

## 2017-05-22 LAB — BASIC METABOLIC PANEL
BUN/Creatinine Ratio: 21 (ref 12–28)
BUN: 36 mg/dL (ref 10–36)
CO2: 21 mmol/L (ref 20–29)
Calcium: 9.6 mg/dL (ref 8.7–10.3)
Chloride: 102 mmol/L (ref 96–106)
Creatinine, Ser: 1.68 mg/dL — ABNORMAL HIGH (ref 0.57–1.00)
GFR calc Af Amer: 30 mL/min/{1.73_m2} — ABNORMAL LOW (ref >59)
GFR calc non Af Amer: 26 mL/min/{1.73_m2} — ABNORMAL LOW (ref >59)
Glucose: 111 mg/dL — ABNORMAL HIGH (ref 65–99)
Potassium: 5.1 mmol/L (ref 3.5–5.2)
Sodium: 139 mmol/L (ref 134–144)

## 2017-05-22 LAB — PRO B NATRIURETIC PEPTIDE: NT-Pro BNP: 1887 pg/mL — ABNORMAL HIGH (ref 0–738)

## 2017-05-28 ENCOUNTER — Ambulatory Visit: Payer: Medicare Other | Admitting: Cardiology

## 2017-06-06 IMAGING — DX DG CHEST 2V
2 series · 2 of 2 positions shown · non-contrast
Comparison: 03/23/2016

CLINICAL DATA: 89 year-old female w/ elevated BP for the past week
and SOB and cough and congestion x 1 month. Hx of Atrial
fibrillation, pulmonary hypertension, CHF

EXAM:
CHEST - 2 VIEW

[chest pa]
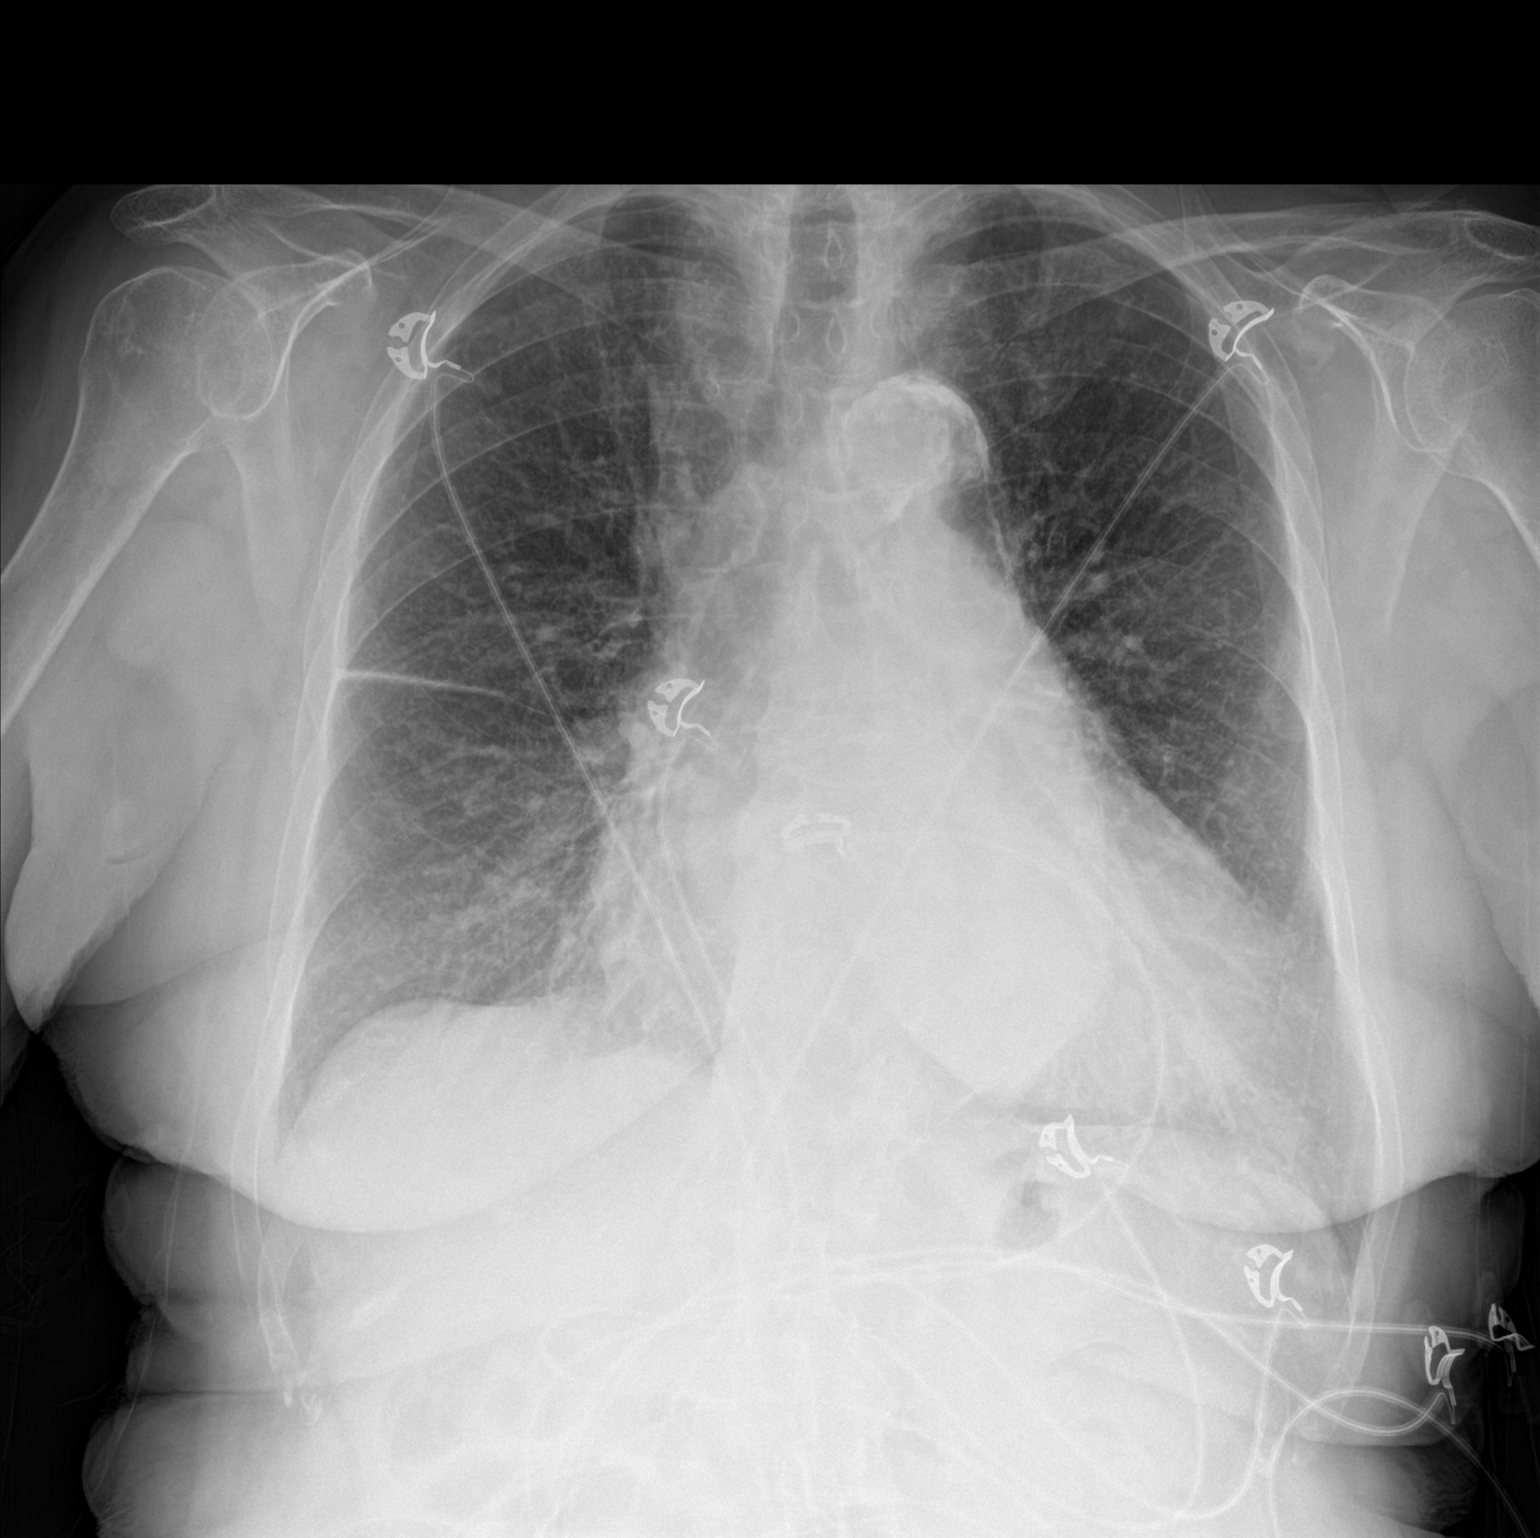

[chest lat]
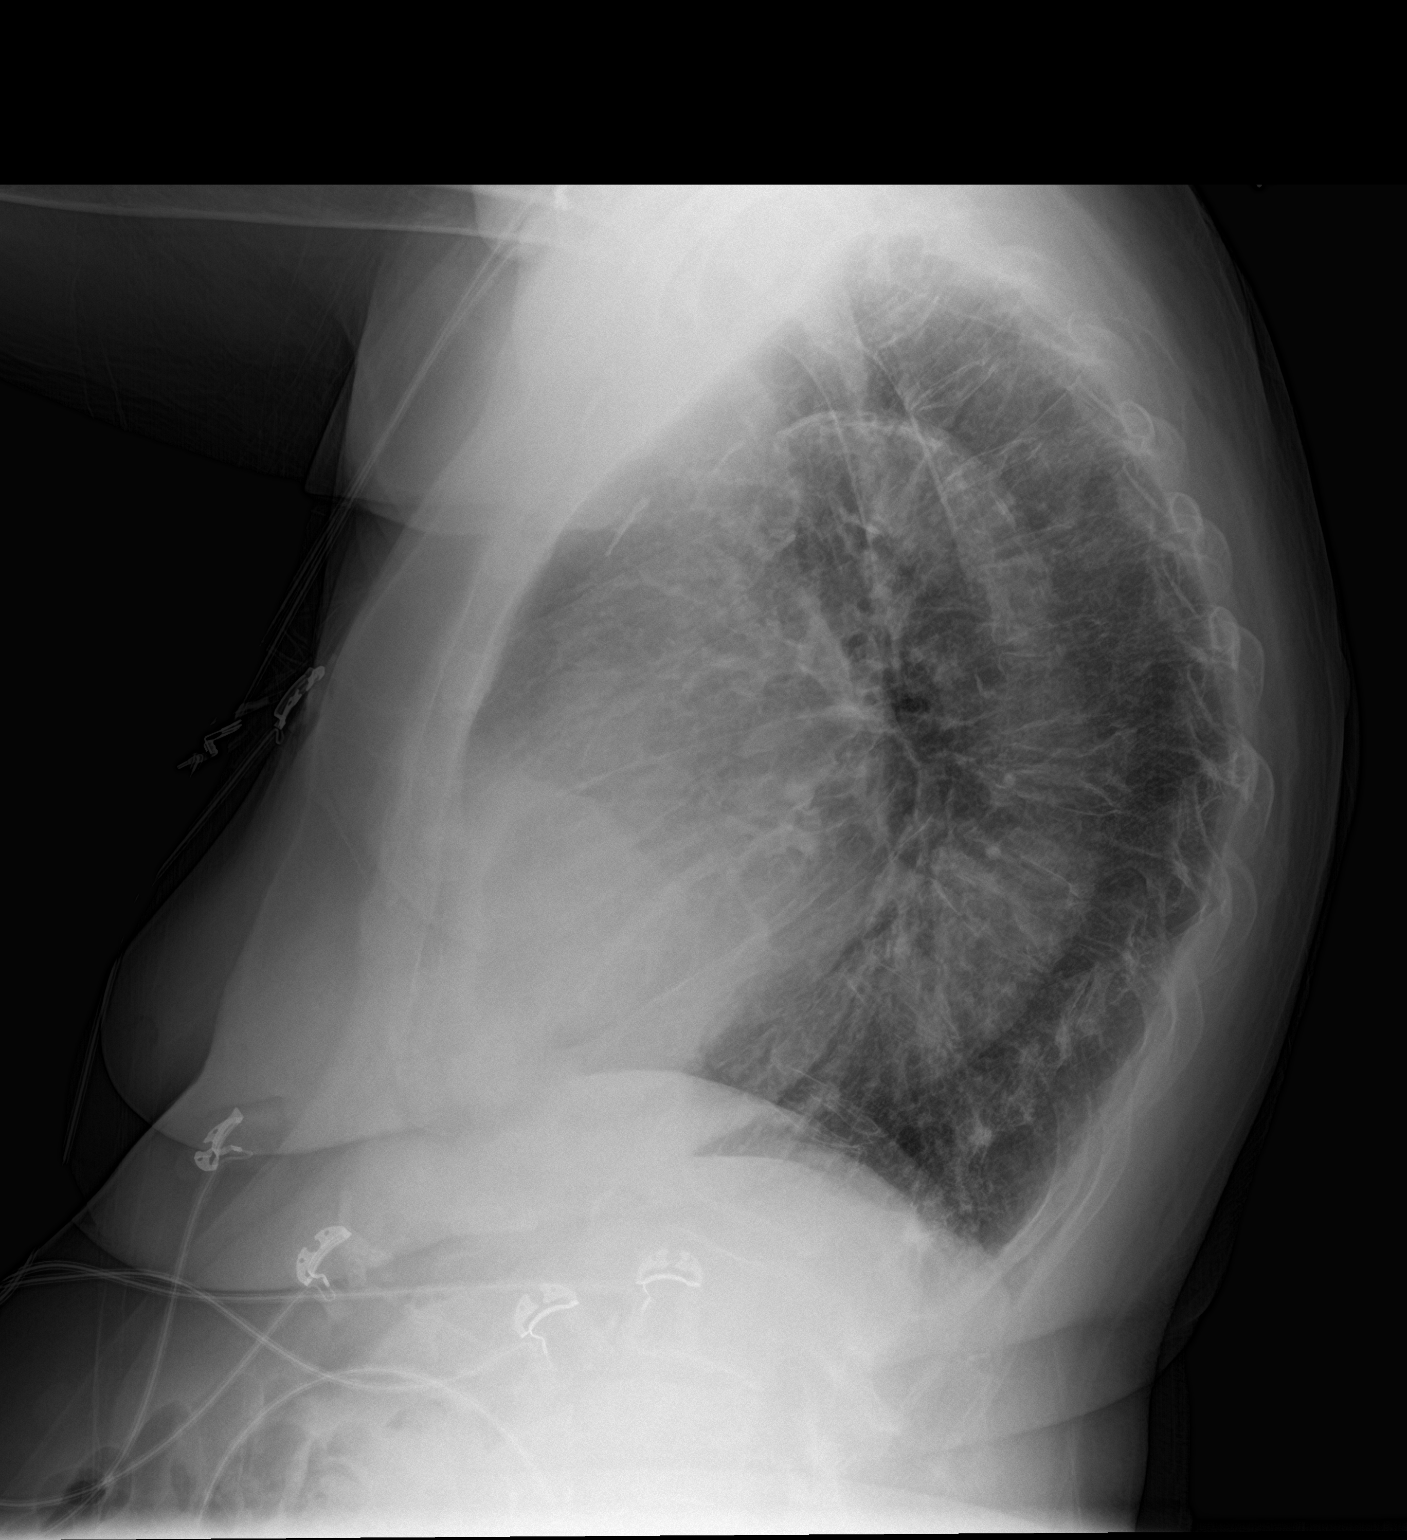

[2 of 2 positions shown; findings below may reference images not displayed]

FINDINGS: Mild bilateral interstitial edema, new since previous.

Stable cardiomegaly.  Tortuous atheromatous thoracic aorta.

No effusion.

Stable mid thoracic vertebral compression deformities.
IMPRESSION: 1. Cardiomegaly with new bilateral interstitial edema.

## 2017-09-15 ENCOUNTER — Other Ambulatory Visit: Payer: Self-pay | Admitting: Cardiology

## 2017-09-22 ENCOUNTER — Encounter: Payer: Self-pay | Admitting: Cardiology

## 2017-09-22 ENCOUNTER — Ambulatory Visit (INDEPENDENT_AMBULATORY_CARE_PROVIDER_SITE_OTHER): Payer: Medicare Other | Admitting: Cardiology

## 2017-09-22 VITALS — BP 132/76 | HR 73 | Ht 67.0 in | Wt 175.0 lb

## 2017-09-22 DIAGNOSIS — I5033 Acute on chronic diastolic (congestive) heart failure: Secondary | ICD-10-CM

## 2017-09-22 DIAGNOSIS — I110 Hypertensive heart disease with heart failure: Secondary | ICD-10-CM

## 2017-09-22 DIAGNOSIS — E782 Mixed hyperlipidemia: Secondary | ICD-10-CM | POA: Diagnosis not present

## 2017-09-22 DIAGNOSIS — I10 Essential (primary) hypertension: Secondary | ICD-10-CM

## 2017-09-22 NOTE — Progress Notes (Signed)
Tammy Vaughn ID: Tammy Vaughn, female   DOB: 28-Feb-1925, 84 y.o.   MRN: [id number]      Cardiology Office Note   Date:  09/22/2017   ID:  Tammy Vaughn, DOB 03/10/1925, MRN [id number]  PCP:  Tammy Vaughn, No Pcp Per  Cardiologist:  Tammy Dawley, MD   Chief complain: 4 months follow up   History of Present Illness: 89-y/o-female with a h/o persistent AF, HTN, CKD III, and pulmonary hypertension. She is on chronic coumadin with INRs followed at her senior living community. She was previously very active, walking often without significant limitations but in late June, she fell and suffered a thoracic vertebrae burst fx. Since her fall, she feels as though she never really got back to her prior level of activity. Nothing specific, just slow to recover. About 2 wks ago, she began to note exertional dyspnea associated with mild chest tightness. Ss last a few mins and resolve with rest. She has never had either dyspnea or chest tightness in the absence of exertion. She denies palpitations. She does not weigh herself @ home. Over the past 2 wks, dyspnea and chest tightness have been occurring with less and less provocation. Over the past 3-4 days, she has also noted increasing lower ext edema. She denies pnd, orthopnea, change in abd girth (though she says she always has a firm abdomen), or early satiety. She has been compliant with her meds (someone at the senior community prepares her meds in a pill counter, two weeks at a time). Today, after talking with her dtr in law, her family decided that she should be evaluated. She presented to the [facility] where she was markedly hypertensive (196/100) and mildly hypoxic @ 89% on RA. BNP was mildly elevated while cxr showed vasc congestion. She was treated with IV lasix with some improvement in Ss and Tx to [facility] for further eval. She was diuresed, her BP meds were adjusted and discharged home euvolemic at 143 lbs.  12/29/2016 - 3 week follow-up, The Tammy Vaughn  states again that she feels fantastic, she has great appetite and walks around her facility without any shortness of breath or chest pain. She states that she sleeps a log, doesn't wake up for shortness of breath or needing to go to the bathroom, she denies any orthopnea or proximal nocturnal dyspnea. At the last visit I have noticed increased weight with no significant fluid overload on physical exam however proBNP was 1800 And creatinine was increased 1.7 from baseline 0.9. Lasix was increased to 40 mg by mouth twice a day. She has not is no change in symptoms since last time since she was asymptomatic at the last visit as well.   05/21/2017 - this is 3 months follow-up, she feels and looks great, she continues to take 20 mg of Lasix and has no orthopnea paroxysmal nocturnal dyspnea and no lower extremity edema. She has been compliant with her warfarin has no bleeding in her INRs are well controlled. They're being checked weekly. She would like to get rid of Lasix if possible. She denies any chest pain shortness of breath. She continues to walk and lives in an independent living facility.  09/22/2017 - the Tammy Vaughn states that she feels great, she has been walking, denies LE edema, orthopnea, no falls, no DOE, no bleeding with Warfarin. She has good appetite, no significant memory impairement.   Past Medical History:  Diagnosis Date  . Aortic regurgitation   . Arthritis   . Burst fracture of thoracic vertebra (HCC)  a. stable burst fracture t9-t10  . Chronic diastolic CHF (congestive heart failure) ([place])   . CKD (chronic kidney disease), stage III ([place])    a. Hypertensive CKD.  . Diverticulosis of large intestine   . Essential hypertension   . High cholesterol   . History of skin cancer   . Hx of right knee surgery   . Iron deficiency   . Mitral regurgitation   . Osteoarthritis   . Persistent atrial fibrillation ([place])    a. 04/2014 Echo: EF 55-60%, mild LVH, mild to mod AI/MR, sev dil LA,  mildly reduced RV fxn, sev dil RA, mild-mod TR, PASP 77mmHg.  Marland Kitchen Pulmonary hypertension ([place])    a. 04/2014 Echo: PASP 73mmHg.  . Tricuspid regurgitation    Past Surgical History:  Procedure Laterality Date  . ABDOMINAL HYSTERECTOMY    . CESAREAN SECTION    . HIP SURGERY Bilateral   . TOTAL KNEE ARTHROPLASTY Right 06/2011   Current Outpatient Medications  Medication Sig Dispense Refill  . acetaminophen (TYLENOL) 325 MG tablet Take 325 mg by mouth 2 (two) times daily as needed (pain).     . carvedilol (COREG) 12.5 MG tablet Take 1 tablet (12.5 mg total) by mouth 2 (two) times daily with a meal. 180 tablet 3  . diltiazem (CARDIZEM) 60 MG tablet Take 60 mg by mouth daily as needed (a fib).    . furosemide (LASIX) 40 MG tablet Take 0.5 tablets (20 mg total) by mouth daily. 45 tablet 2  . isosorbide mononitrate (IMDUR) 30 MG 24 hr tablet Take 0.5 tablets (15 mg total) by mouth daily. 90 tablet 3  . losartan (COZAAR) 50 MG tablet Take 1 tablet (50 mg total) by mouth daily. 90 tablet 3  . mirtazapine (REMERON) 7.5 MG tablet Take 7.5 mg by mouth at bedtime.    . potassium chloride (K-DUR) 10 MEQ tablet Take 10 mEq by mouth daily.    Marland Kitchen senna (SENOKOT) 8.6 MG tablet Take 1 tablet by mouth daily as needed for constipation.    Marland Kitchen warfarin (COUMADIN) 4 MG tablet Take 4 mg by mouth daily.    Marland Kitchen warfarin (COUMADIN) 5 MG tablet Take 5 mg by mouth as directed.    Marland Kitchen ipratropium (ATROVENT) 0.03 % nasal spray Place 2 sprays into both nostrils 3 (three) times daily.    Marland Kitchen spironolactone (ALDACTONE) 25 MG tablet Take 25 mg by mouth daily.     No current facility-administered medications for this visit.    Allergies:   Morphine; Morphine and related; Tape; Amlodipine; and Codeine   Social History:  The Tammy Vaughn  reports that she has never smoked. She has never used smokeless tobacco. She reports that she does not drink alcohol or use drugs.   Family History:  The Tammy Vaughn's family history includes Hypertension in  her brother; Stroke in her mother.   ROS:  Please see the history of present illness.   Otherwise, review of systems are positive for none.   All other systems are reviewed and negative.   PHYSICAL EXAM: VS:  BP 132/76   Pulse 73   Ht 5\' 7"  (1.702 m)   Wt 175 lb (79.4 kg)   SpO2 92%   BMI 27.41 kg/m  , BMI Body mass index is 27.41 kg/m. GEN: Well nourished, well developed, in no acute distress  HEENT: normal  Neck: no JVD, carotid bruits, or masses Cardiac: RRR; no murmurs, rubs, or gallops,no edema  Respiratory:  clear to auscultation bilaterally, normal work  of breathing GI: soft, nontender, nondistended, + BS MS: no deformity or atrophy  Skin: warm and dry, no rash Neuro:  Strength and sensation are intact Psych: euthymic mood, full affect  EKG:  EKG is ordered today. The ekg ordered today demonstrates atrial fibrillation, 90 BPM  Recent Labs: 12/07/2016: ALT 10; Magnesium 2.3; TSH 1.050 05/21/2017: BUN 36; Creatinine, Ser 1.68; Hemoglobin 13.7; NT-Pro BNP 1,887; Platelets 150; Potassium 5.1; Sodium 139   Lipid Panel No results found for: CHOL, TRIG, HDL, CHOLHDL, VLDL, LDLCALC, LDLDIRECT   Wt Readings from Last 3 Encounters:  09/22/17 175 lb (79.4 kg)  05/21/17 175 lb 9.6 oz (79.7 kg)  02/24/17 177 lb (80.3 kg)    EKG performed today and personally reviewed, and shows atrial fibrillation with occasional PVCs unchanged from prior.    ASSESSMENT AND PLAN:  1. Acute Diastolic CHF: She looks euvolemic today again, NYHA IIa (or I given her age), we'll check labs prior to the next visit.  2. Essential hypertension -  well controlled on current regimen   3. CKD III: Crea 1.5- 1.7, continue low dose lasix 20 mg po daily.  4. HL: Mildly worsening memory, we discontinued simvastatin.   5. Persistent AFib: Rate controlled chads2vasc score is 5.  On coumadin.  No bleeding or falls.   Follow up in 4 months. Labs prior to the visit.  Signed, Tammy Dawley, MD    09/22/2017 10:45 AM    [facility] [address], [place], [place]  [address] Phone: [phone]; Fax: [phone]

## 2017-09-22 NOTE — Patient Instructions (Signed)
Medication Instructions:   Your physician recommends that you continue on your current medications as directed. Please refer to the Current Medication list given to you today.    Labwork:  IN 4 MONTHS--PRIOR TO YOUR 4 MONTH FOLLOW-UP APPOINTMENT WITH DR NELSON TO CHECK---CMET, CBC W DIFF, TSH, AND PRO-BNP--SCHEDULING PLEASE GO AHEAD AND SCHEDULE THE PATIENTS 4 MONTH LAB TODAY    Follow-Up:  4 MONTHS WITH DR NELSON---PLEASE SCHEDULE HER LAB NOW, PRIOR TO HER 4 MONTH FOLLOW-UP APPOINTMENT       If you need a refill on your cardiac medications before your next appointment, please call your pharmacy.

## 2017-09-29 ENCOUNTER — Telehealth: Payer: Self-pay | Admitting: Cardiology

## 2017-09-29 NOTE — Telephone Encounter (Signed)
[name] with [facility] is calling to request an update med list on this pt, to be faxed to her at [phone].  Per [name], the pt needs an updated list to place in her chart at [facility].  Fax number is [phone] attention to [facility].  Informed [name] that I will send this now.  [name] verbalized understanding and agrees with this plan.

## 2017-09-29 NOTE — Telephone Encounter (Signed)
Follow up     [name] with [facility] is returning call in reference to medications. Please call.    Pt c/o medication issue:  1. Name of Medication: diltiazem (CARDIZEM) 60 MG tablet  2. How are you currently taking this medication (dosage and times per day)?   3. Are you having a reaction (difficulty breathing--STAT)?   4. What is your medication issue? Angela from [facility] facility is trying to verify if this pt needs to be taking this medication.

## 2017-09-29 NOTE — Telephone Encounter (Signed)
New Message:       Pt c/o medication issue:  1. Name of Medication: diltiazem (CARDIZEM) 60 MG tablet  2. How are you currently taking this medication (dosage and times per day)?   3. Are you having a reaction (difficulty breathing--STAT)?   4. What is your medication issue? Angela from [facility] facility is trying to verify if this pt needs to be taking this medication.

## 2017-09-29 NOTE — Telephone Encounter (Signed)
Left a message for [name] at [facility] to call back, to assist with med questions on this pt.

## 2017-10-06 ENCOUNTER — Other Ambulatory Visit: Payer: Self-pay | Admitting: Cardiology

## 2018-01-12 ENCOUNTER — Telehealth: Payer: Self-pay | Admitting: Cardiology

## 2018-01-12 NOTE — Telephone Encounter (Signed)
New message    Patient's son wants to know if blood work can be done for his mother at [facility] and the results be sent to this office? Please advise.

## 2018-01-12 NOTE — Telephone Encounter (Signed)
Son is calling to ask if the pt can have her labs done at her [facility], vs coming into the office on 10/7.  Endorsed to the pts Son this will be no problem at all.  [facility] and spoke with the Social Worker there who request that I fax over the lab orders to their facility at [phone] ATTN: Fleet Contras NP. Order for their facility to draw a CMET, CBC W DIFF, TSH, AND PRO-BNP on this pt and fax the results back to [place] at [phone] attention to [facility] and Dr Meda Coffee.  Diagnosis provided as well.  [place] to fax [place] these results.  Cancelled the pts lab appt for 10/7.

## 2018-01-17 ENCOUNTER — Other Ambulatory Visit: Payer: Medicare Other

## 2018-01-24 ENCOUNTER — Ambulatory Visit (INDEPENDENT_AMBULATORY_CARE_PROVIDER_SITE_OTHER): Payer: Medicare Other | Admitting: Cardiology

## 2018-01-24 ENCOUNTER — Encounter: Payer: Self-pay | Admitting: Cardiology

## 2018-01-24 ENCOUNTER — Encounter (INDEPENDENT_AMBULATORY_CARE_PROVIDER_SITE_OTHER): Payer: Self-pay

## 2018-01-24 VITALS — BP 122/64 | HR 71 | Ht 67.0 in | Wt 164.8 lb

## 2018-01-24 DIAGNOSIS — I482 Chronic atrial fibrillation, unspecified: Secondary | ICD-10-CM | POA: Diagnosis not present

## 2018-01-24 DIAGNOSIS — Z7901 Long term (current) use of anticoagulants: Secondary | ICD-10-CM

## 2018-01-24 DIAGNOSIS — E782 Mixed hyperlipidemia: Secondary | ICD-10-CM | POA: Diagnosis not present

## 2018-01-24 DIAGNOSIS — I10 Essential (primary) hypertension: Secondary | ICD-10-CM | POA: Diagnosis not present

## 2018-01-24 DIAGNOSIS — I5033 Acute on chronic diastolic (congestive) heart failure: Secondary | ICD-10-CM

## 2018-01-24 NOTE — Patient Instructions (Addendum)
Medication Instructions:   Your physician recommends that you continue on your current medications as directed. Please refer to the Current Medication list given to you today.  If you need a refill on your cardiac medications before your next appointment, please call your pharmacy.      Your physician recommends that you return for lab work in: DR. Meda Coffee HAS WRITTEN YOU A HARD SCRIPT TO HAVE YOUR LABS DONE IN 3 1/2 MONTHS, BY YOUR NURSING FACILITY-- THEY WILL NEED TO CHECK A CBC W DIFF, CMET, AND PT/INR.  PLEASE HAVE THEM FAX RESULTS TO [phone] ATTN: DR NELSON     Follow-Up: At [facility], you and your health needs are our priority.  As part of our continuing mission to provide you with exceptional heart care, we have created designated Provider Care Teams.  These Care Teams include your primary Cardiologist (physician) and Advanced Practice Providers (APPs -  Physician Assistants and Nurse Practitioners) who all work together to provide you with the care you need, when you need it. You will need a follow up appointment in 4 months.  Please call our office 2 months in advance to schedule this appointment.  You may see Ena Dawley, MD or one of the following Advanced Practice Providers on your designated Care Team:   [place], PA-C Melina Copa, PA-C . Ermalinda Barrios, PA-C

## 2018-01-24 NOTE — Progress Notes (Signed)
Patient ID: Kaprice Kage, female   DOB: 1924/07/03, 84 y.o.   MRN: [id number]      Cardiology Office Note   Date:  01/24/2018   ID:  [name], DOB 06/11/1924, MRN [id number]  PCP:  Patient, No Pcp Per  Cardiologist:  Ena Dawley, MD   Chief complain: 82 years follow up   History of Present Illness: 84-y/o-female with a h/o persistent AF, HTN, CKD III, chronic diastolic CHF and pulmonary hypertension. She is on chronic coumadin with INRs followed at her senior living community.  The patient is coming after 6 months, she fell in July of this year and broke her right arm, as a result she cannot use cane or walker and has been minimally active.  She has gained weight and also developed swelling in right lower extremity that she does not use as much when she walks with a cane in her left hand.  She denies orthopnea or proximal nocturnal dyspnea, mild dyspnea on exertion, no chest pain, no dizziness and no other fall other than the one stated above.  Past Medical History:  Diagnosis Date  . Aortic regurgitation   . Arthritis   . Burst fracture of thoracic vertebra (HCC)    a. stable burst fracture t9-t10  . Chronic diastolic CHF (congestive heart failure) ([place])   . CKD (chronic kidney disease), stage III ([place])    a. Hypertensive CKD.  . Diverticulosis of large intestine   . Essential hypertension   . High cholesterol   . History of skin cancer   . Hx of right knee surgery   . Iron deficiency   . Mitral regurgitation   . Osteoarthritis   . Persistent atrial fibrillation ([place])    a. 04/2014 Echo: EF 55-60%, mild LVH, mild to mod AI/MR, sev dil LA, mildly reduced RV fxn, sev dil RA, mild-mod TR, PASP 70mmHg.  Marland Kitchen Pulmonary hypertension ([place])    a. 04/2014 Echo: PASP 35mmHg.  . Tricuspid regurgitation    Past Surgical History:  Procedure Laterality Date  . ABDOMINAL HYSTERECTOMY    . CESAREAN SECTION    . HIP SURGERY Bilateral   . TOTAL KNEE ARTHROPLASTY Right 06/2011    Current Outpatient Medications  Medication Sig Dispense Refill  . acetaminophen (TYLENOL) 325 MG tablet Take 325 mg by mouth 2 (two) times daily as needed (pain).     . carvedilol (COREG) 12.5 MG tablet Take 1 tablet (12.5 mg total) by mouth 2 (two) times daily with a meal. 180 tablet 3  . diltiazem (CARDIZEM) 60 MG tablet TAKE ONE (1) TABLET EACH DAY AS NEEDED FOR ATRIAL FIBRILLATION 30 tablet 8  . furosemide (LASIX) 40 MG tablet Take 0.5 tablets (20 mg total) by mouth daily. 45 tablet 2  . ipratropium (ATROVENT) 0.03 % nasal spray Place 2 sprays into both nostrils 3 (three) times daily.    . isosorbide mononitrate (IMDUR) 30 MG 24 hr tablet Take 0.5 tablets (15 mg total) by mouth daily. 90 tablet 3  . losartan (COZAAR) 50 MG tablet Take 1 tablet (50 mg total) by mouth daily. 90 tablet 3  . mirtazapine (REMERON) 7.5 MG tablet Take 7.5 mg by mouth at bedtime.    . potassium chloride (K-DUR) 10 MEQ tablet Take 10 mEq by mouth daily.    Marland Kitchen senna (SENOKOT) 8.6 MG tablet Take 1 tablet by mouth daily as needed for constipation.    Marland Kitchen spironolactone (ALDACTONE) 25 MG tablet Take 25 mg by mouth daily.    Marland Kitchen  warfarin (COUMADIN) 4 MG tablet Take 4 mg by mouth daily.    Marland Kitchen warfarin (COUMADIN) 5 MG tablet Take 5 mg by mouth as directed.     No current facility-administered medications for this visit.    Allergies:   Morphine; Morphine and related; Tape; Amlodipine; and Codeine   Social History:  The patient  reports that she has never smoked. She has never used smokeless tobacco. She reports that she does not drink alcohol or use drugs.   Family History:  The patient's family history includes Hypertension in her brother; Stroke in her mother.   ROS:  Please see the history of present illness.   Otherwise, review of systems are positive for none.   All other systems are reviewed and negative.   PHYSICAL EXAM: VS:  Ht 5\' 7"  (1.702 m)   Wt 164 lb 12.8 oz (74.8 kg)   BMI 25.81 kg/m  , BMI Body mass  index is 25.81 kg/m. GEN: Well nourished, well developed, in no acute distress  HEENT: normal  Neck: no JVD, carotid bruits, or masses Cardiac: RRR; no murmurs, rubs, or gallops, mild RLE edema  Respiratory:  clear to auscultation bilaterally, normal work of breathing GI: soft, nontender, nondistended, + BS MS: no deformity or atrophy  Skin: warm and dry, no rash Neuro:  Strength and sensation are intact Psych: euthymic mood, full affect  EKG:  EKG is ordered today. The ekg ordered today demonstrates atrial fibrillation, with PVCs, unchanged from prior.  Personally reviewed.  Recent Labs: 05/21/2017: BUN 36; Creatinine, Ser 1.68; Hemoglobin 13.7; NT-Pro BNP 1,887; Platelets 150; Potassium 5.1; Sodium 139   Lipid Panel No results found for: CHOL, TRIG, HDL, CHOLHDL, VLDL, LDLCALC, LDLDIRECT   Wt Readings from Last 3 Encounters:  01/24/18 164 lb 12.8 oz (74.8 kg)  09/22/17 175 lb (79.4 kg)  05/21/17 175 lb 9.6 oz (79.7 kg)     ASSESSMENT AND PLAN:  1. Acute Diastolic CHF: She looks euvolemic today again, she has swelling in her right lower extremity secondary to inactivity, DVT unlikely given chronic warfarin use.  She is advised to use ankle pumps during the day as well as use of compression sock.  2. Essential hypertension -  well controlled on current regimen   3. CKD III: Crea 1.5- 1.7, continue low dose lasix 20 mg po daily.  4. HL: Mildly worsening memory, we discontinued simvastatin.   5. Persistent AFib: Rate controlled chads2vasc score is 5.  On coumadin.  No bleeding, one recent fall complicated by arm fracture, will continue Coumadin for now.   Follow up in 4 months. Labs prior to the visit.  Signed, Ena Dawley, MD  01/24/2018 10:39 AM    [facility] [address], [place], [place]  [address] Phone: [phone]; Fax: [phone]

## 2018-05-18 ENCOUNTER — Encounter: Payer: Self-pay | Admitting: Cardiology

## 2018-05-18 ENCOUNTER — Telehealth: Payer: Self-pay | Admitting: Cardiology

## 2018-05-18 ENCOUNTER — Ambulatory Visit (INDEPENDENT_AMBULATORY_CARE_PROVIDER_SITE_OTHER): Payer: Medicare Other | Admitting: Cardiology

## 2018-05-18 VITALS — BP 118/60 | HR 54 | Ht 67.0 in | Wt 143.0 lb

## 2018-05-18 DIAGNOSIS — I5033 Acute on chronic diastolic (congestive) heart failure: Secondary | ICD-10-CM | POA: Diagnosis not present

## 2018-05-18 DIAGNOSIS — E782 Mixed hyperlipidemia: Secondary | ICD-10-CM

## 2018-05-18 DIAGNOSIS — I110 Hypertensive heart disease with heart failure: Secondary | ICD-10-CM

## 2018-05-18 DIAGNOSIS — Z7901 Long term (current) use of anticoagulants: Secondary | ICD-10-CM | POA: Diagnosis not present

## 2018-05-18 MED ORDER — SPIRONOLACTONE 25 MG PO TABS: 12.5000 mg | ORAL_TABLET | Freq: Every day | ORAL | 3 refills | Status: AC

## 2018-05-18 NOTE — Telephone Encounter (Signed)
Notes recorded by Nuala Alpha, LPN on 06/18/8717 at 5:97 PM EST Endorsed to the pts Nurse at [facility] of pts lab results and recommendations per Dr Meda Coffee, for the pt to continue her meds as advised this morning.  Informed Ambulance person at [facility] that her cbc w diff is still pending, and once that comes back and Dr Meda Coffee advises, I will then fax her the pts labs, as requested, at [phone] attention to [place]. Wells Guiles RN verbalized understanding and agrees with this plan. ------  Notes recorded by Dorothy Spark, MD on 05/18/2018 at 4:55 PM EST Significantly improved kidney function, stable BNP, continue medications as advised this morning.

## 2018-05-18 NOTE — Patient Instructions (Addendum)
Medication Instructions:   STOP TAKING IMDUR (ISOSORBIDE MONONITRATE) NOW  DECREASE YOUR SPIRONOLACTONE TO 12.5 MG BY MOUTH DAILY  If you need a refill on your cardiac medications before your next appointment, please call your pharmacy.     Lab work:  TODAY--BMET, CBC W DIFF, AND PRO-BNP  If you have labs (blood work) drawn today and your tests are completely normal, you will receive your results only by: Marland Kitchen MyChart Message (if you have MyChart) OR . A paper copy in the mail If you have any lab test that is abnormal or we need to change your treatment, we will call you to review the results.    Follow-Up:  3 MONTHS WITH DR NELSON--PLEASE ADD THE PATIENT ON DR NELSON'S SCHEDULE FOR 08/25/18 AT HER 10:20 AM END SLOT--OK PER IVY AND DR Meda Coffee

## 2018-05-18 NOTE — Telephone Encounter (Signed)
New Message   Wells Guiles nurse from [facility] needs lab results faxed to [phone], and needs to clarify weather the patient is on Aldactone or not.

## 2018-05-18 NOTE — Progress Notes (Signed)
Patient ID: Tammy Vaughn, female   DOB: 1924-04-29, 85 y.o.   MRN: [id number]      Cardiology Office Note  Date:  05/18/2018   ID:  Tammy Vaughn, DOB 1925-01-10, MRN [id number]  PCP:  Patient, No Pcp Per  Cardiologist:  Ena Dawley, MD   Chief complain: 6 months follow up   History of Present Illness: 85-y/o-female with a h/o persistent AF, HTN, CKD III, chronic diastolic CHF and pulmonary hypertension. She is on chronic coumadin with INRs followed at her senior living community.  The patient is coming after 6 months, she fell in July of 2019.  05/18/2018 -the patient is coming after 83 months, she has been doing well, her appetite has decreased and she has lost 20 pounds in the last 4 months.  She denies any lower extremity edema orthopnea proximal nocturnal dyspnea.  She has not had any chest pain, unfortunately she fell again yesterday and broke a finger on her right hand.  She denies any bleeding.  Past Medical History:  Diagnosis Date  . Aortic regurgitation   . Arthritis   . Burst fracture of thoracic vertebra (HCC)    a. stable burst fracture t9-t10  . Chronic diastolic CHF (congestive heart failure) ([place])   . CKD (chronic kidney disease), stage III ([place])    a. Hypertensive CKD.  . Diverticulosis of large intestine   . Essential hypertension   . High cholesterol   . History of skin cancer   . Hx of right knee surgery   . Iron deficiency   . Mitral regurgitation   . Osteoarthritis   . Persistent atrial fibrillation    a. 04/2014 Echo: EF 55-60%, mild LVH, mild to mod AI/MR, sev dil LA, mildly reduced RV fxn, sev dil RA, mild-mod TR, PASP 54mmHg.  Marland Kitchen Pulmonary hypertension ([place])    a. 04/2014 Echo: PASP 66mmHg.  . Tricuspid regurgitation    Past Surgical History:  Procedure Laterality Date  . ABDOMINAL HYSTERECTOMY    . CESAREAN SECTION    . HIP SURGERY Bilateral   . TOTAL KNEE ARTHROPLASTY Right 06/2011   Current Outpatient Medications  Medication Sig Dispense  Refill  . acetaminophen (TYLENOL) 325 MG tablet Take 325 mg by mouth 2 (two) times daily as needed (pain).     . carvedilol (COREG) 12.5 MG tablet Take 1 tablet (12.5 mg total) by mouth 2 (two) times daily with a meal. 180 tablet 3  . diltiazem (CARDIZEM) 60 MG tablet TAKE ONE (1) TABLET EACH DAY AS NEEDED FOR ATRIAL FIBRILLATION 30 tablet 8  . furosemide (LASIX) 40 MG tablet Take 0.5 tablets (20 mg total) by mouth daily. 45 tablet 2  . isosorbide mononitrate (IMDUR) 30 MG 24 hr tablet Take 0.5 tablets (15 mg total) by mouth daily. 90 tablet 3  . losartan (COZAAR) 50 MG tablet Take 1 tablet (50 mg total) by mouth daily. 90 tablet 3  . mirtazapine (REMERON) 7.5 MG tablet Take 7.5 mg by mouth at bedtime.    . potassium chloride (K-DUR) 10 MEQ tablet Take 10 mEq by mouth daily.    Marland Kitchen senna (SENOKOT) 8.6 MG tablet Take 1 tablet by mouth daily as needed for constipation.    Marland Kitchen warfarin (COUMADIN) 4 MG tablet Take 4 mg by mouth daily.    Marland Kitchen warfarin (COUMADIN) 5 MG tablet Take 5 mg by mouth as directed.    Marland Kitchen ipratropium (ATROVENT) 0.03 % nasal spray Place 2 sprays into both nostrils 3 (three) times daily.    Marland Kitchen  spironolactone (ALDACTONE) 25 MG tablet Take 25 mg by mouth daily.     No current facility-administered medications for this visit.    Allergies:   Morphine; Morphine and related; Tape; Amlodipine; and Codeine   Social History:  The patient  reports that she has never smoked. She has never used smokeless tobacco. She reports that she does not drink alcohol or use drugs.   Family History:  The patient's family history includes Hypertension in her brother; Stroke in her mother.   ROS:  Please see the history of present illness.   Otherwise, review of systems are positive for none.   All other systems are reviewed and negative.   PHYSICAL EXAM: VS:  BP 118/60   Pulse (!) 54   Ht 5\' 7"  (1.702 m)   Wt 143 lb (64.9 kg)   SpO2 95%   BMI 22.40 kg/m  , BMI Body mass index is 22.4 kg/m. GEN:  Well nourished, well developed, in no acute distress  HEENT: normal  Neck: no JVD, carotid bruits, or masses Cardiac: RRR; no murmurs, rubs, or gallops, mild RLE edema  Respiratory:  clear to auscultation bilaterally, normal work of breathing GI: soft, nontender, nondistended, + BS MS: no deformity or atrophy  Skin: warm and dry, no rash Neuro:  Strength and sensation are intact Psych: euthymic mood, full affect  EKG:  EKG is ordered today. The ekg ordered today demonstrates atrial fibrillation, with PVCs, unchanged from prior.  Personally reviewed.  Recent Labs: 05/21/2017: BUN 36; Creatinine, Ser 1.68; Hemoglobin 13.7; NT-Pro BNP 1,887; Platelets 150; Potassium 5.1; Sodium 139   Lipid Panel No results found for: CHOL, TRIG, HDL, CHOLHDL, VLDL, LDLCALC, LDLDIRECT   Wt Readings from Last 3 Encounters:  05/18/18 143 lb (64.9 kg)  01/24/18 164 lb 12.8 oz (74.8 kg)  09/22/17 175 lb (79.4 kg)     ASSESSMENT AND PLAN:  1. Chronic diastolic CHF: She looks euvolemic today again, I am concerned that she might have fallen as a result of hypertension, I will decrease spironolactone to 12.5 mg daily. I will obtain CMP and BNP today.  2. Essential hypertension -blood pressure rather low for age, I will decrease spironolactone to 12.5 mg p.o. daily and eliminate Imdur.  3. CKD III: Crea 1.5- 1.7, continue low dose lasix 20 mg po daily and spironolactone 25 mg p.o. daily  4. HL: Mildly worsening memory, we discontinued simvastatin.   5. Persistent AFib: Rate controlled chads2vasc score is 5.  On coumadin.  No bleeding, one recent fall complicated by arm fracture, will continue Coumadin for now.  Check CBC today.  Follow up in 3 months. Labs today.  Signed, Ena Dawley, MD  05/18/2018 10:49 AM    [facility] [address], [place], [place]  [address] Phone: [phone]; Fax: [phone]

## 2018-05-18 NOTE — Telephone Encounter (Signed)
Endorsed to [employer] at [facility] that the pt is to decrease her spironolactone to 12.5 mg po daily, and we discontinued her Imdur.  Informed Wells Guiles RN that when the pts labs come back, we will call them at the facility, and fax the results as requested.  Wells Guiles verbalized understanding and agrees with this plan.

## 2018-05-19 LAB — BASIC METABOLIC PANEL
BUN/Creatinine Ratio: 16 (ref 12–28)
BUN: 19 mg/dL (ref 10–36)
CO2: 21 mmol/L (ref 20–29)
Calcium: 9.3 mg/dL (ref 8.7–10.3)
Chloride: 101 mmol/L (ref 96–106)
Creatinine, Ser: 1.18 mg/dL — ABNORMAL HIGH (ref 0.57–1.00)
GFR calc Af Amer: 46 mL/min/{1.73_m2} — ABNORMAL LOW (ref >59)
GFR calc non Af Amer: 40 mL/min/{1.73_m2} — ABNORMAL LOW (ref >59)
Glucose: 116 mg/dL — ABNORMAL HIGH (ref 65–99)
Potassium: 4.5 mmol/L (ref 3.5–5.2)
Sodium: 138 mmol/L (ref 134–144)

## 2018-05-19 LAB — CBC WITH DIFFERENTIAL/PLATELET
Basophils Absolute: 0 10*3/uL (ref 0.0–0.2)
Basos: 1 %
EOS (ABSOLUTE): 0.2 10*3/uL (ref 0.0–0.4)
Eos: 2 %
Hematocrit: 40.7 % (ref 34.0–46.6)
Hemoglobin: 13.3 g/dL (ref 11.1–15.9)
Immature Grans (Abs): 0 10*3/uL (ref 0.0–0.1)
Immature Granulocytes: 0 %
Lymphocytes Absolute: 1.2 10*3/uL (ref 0.7–3.1)
Lymphs: 16 %
MCH: 31.5 pg (ref 26.6–33.0)
MCHC: 32.7 g/dL (ref 31.5–35.7)
MCV: 96 fL (ref 79–97)
Monocytes Absolute: 0.9 10*3/uL (ref 0.1–0.9)
Monocytes: 11 %
Neutrophils Absolute: 5.6 10*3/uL (ref 1.4–7.0)
Neutrophils: 70 %
Platelets: 130 10*3/uL — ABNORMAL LOW (ref 150–450)
RBC: 4.22 x10E6/uL (ref 3.77–5.28)
RDW: 13.1 % (ref 11.7–15.4)
WBC: 8 10*3/uL (ref 3.4–10.8)

## 2018-05-19 LAB — PRO B NATRIURETIC PEPTIDE: NT-Pro BNP: 2082 pg/mL — ABNORMAL HIGH (ref 0–738)

## 2018-08-25 ENCOUNTER — Ambulatory Visit: Payer: Medicare Other | Admitting: Cardiology

## 2018-12-21 ENCOUNTER — Telehealth: Payer: Self-pay | Admitting: *Deleted

## 2018-12-21 ENCOUNTER — Encounter: Payer: Self-pay | Admitting: *Deleted

## 2018-12-21 NOTE — Telephone Encounter (Signed)
Virtual Visit Pre-Appointment Phone Call  "(Name), I am calling you today to discuss your upcoming appointment. We are currently trying to limit exposure to the virus that causes COVID-19 by seeing patients at home rather than in the office."  1. "What is the BEST phone number to call the day of the visit?" - include this in appointment notes  2. Do you have or have access to (through a family member/friend) a smartphone with video capability that we can use for your visit?" a. If yes - list this number in appt notes as cell (if different from BEST phone #) and list the appointment type as a VIDEO visit in appointment notes b. If no - list the appointment type as a PHONE visit in appointment notes -[place] TO CALL THE PTS NURSING HOME [place] RN CARING FOR THE PT AT [phone] TO HELP ASSIST PT WITH THIS VISIT-  3. Confirm consent - "In the setting of the current Covid19 crisis, you are scheduled for a (phone ) visit with Dr. Meda Coffee on 9/10 at 0920.  Just as we do with many in-office visits, in order for you to participate in this visit, we must obtain consent.  If you'd like, I can send this to your mychart (if signed up) or email for you to review.  Otherwise, I can obtain your verbal consent now.  All virtual visits are billed to your insurance company just like a normal visit would be.  By agreeing to a virtual visit, we'd like you to understand that the technology does not allow for your provider to perform an examination, and thus may limit your provider's ability to fully assess your condition. If your provider identifies any concerns that need to be evaluated in person, we will make arrangements to do so.  Finally, though the technology is pretty good, we cannot assure that it will always work on either your or our end, and in the setting of a video visit, we may have to convert it to a phone-only visit.  In either situation, we cannot ensure that we  have a secure connection.  Are you willing to proceed?" STAFF: Did the patient verbally acknowledge consent to telehealth visit? Document YES/NO here: YES SON GAVE VERBAL CONSENT FOR DR NELSON TO TREAT THE PT VIA TELEPHONE VISIT THROUGH HER NURSING HOME ON 9/10. LEE Noga IS SON AND ON DPR  4. Advise patient to be prepared - "Two hours prior to your appointment, go ahead and check your blood pressure, pulse, oxygen saturation, and your weight (if you have the equipment to check those) and write them all down. When your visit starts, your provider will ask you for this information. If you have an Apple Watch or Kardia device, please plan to have heart rate information ready on the day of your appointment. Please have a pen and paper handy nearby the day of the visit as well."-JAMESS RN AT [facility] BP/HR/WT AND GIVE THESE TO [place] PRIOR TO THE VISIT.    5. Inform patient they will receive a phone call 15 minutes prior to their appointment time (may be from unknown caller ID) so they should be prepared to answer-YES JAMESS RN AT [place] has been deemed a candidate for a follow-up tele-health visit to limit community exposure during the Covid-19 pandemic. I spoke with the patient via phone to ensure availability of phone/video  source, confirm preferred email & phone number, and discuss instructions and expectations.  I reminded Azori Baranski to be prepared with any vital sign and/or heart rhythm information that could potentially be obtained via home monitoring, at the time of her visit. I reminded Joeliz Castellan to expect a phone call prior to her visit.  Nuala Alpha, LPN [id number]        FULL LENGTH CONSENT FOR TELE-HEALTH VISIT   I hereby voluntarily request, consent and authorize [facility] and its employed or contracted physicians, physician assistants, nurse practitioners or other licensed health care  professionals (the Practitioner), to provide me with telemedicine health care services (the Services") as deemed necessary by the treating Practitioner. I acknowledge and consent to receive the Services by the Practitioner via telemedicine. I understand that the telemedicine visit will involve communicating with the Practitioner through live audiovisual communication technology and the disclosure of certain medical information by electronic transmission. I acknowledge that I have been given the opportunity to request an in-person assessment or other available alternative prior to the telemedicine visit and am voluntarily participating in the telemedicine visit.  I understand that I have the right to withhold or withdraw my consent to the use of telemedicine in the course of my care at any time, without affecting my right to future care or treatment, and that the Practitioner or I may terminate the telemedicine visit at any time. I understand that I have the right to inspect all information obtained and/or recorded in the course of the telemedicine visit and may receive copies of available information for a reasonable fee.  I understand that some of the potential risks of receiving the Services via telemedicine include:   Delay or interruption in medical evaluation due to technological equipment failure or disruption;  Information transmitted may not be sufficient (e.g. poor resolution of images) to allow for appropriate medical decision making by the Practitioner; and/or   In rare instances, security protocols could fail, causing a breach of personal health information.  Furthermore, I acknowledge that it is my responsibility to provide information about my medical history, conditions and care that is complete and accurate to the best of my ability. I acknowledge that Practitioner's advice, recommendations, and/or decision may be based on factors not within their control, such as incomplete or inaccurate  data provided by me or distortions of diagnostic images or specimens that may result from electronic transmissions. I understand that the practice of medicine is not an exact science and that Practitioner makes no warranties or guarantees regarding treatment outcomes. I acknowledge that I will receive a copy of this consent concurrently upon execution via email to the email address I last provided but may also request a printed copy by calling the office of [place].    I understand that my insurance will be billed for this visit.   I have read or had this consent read to me.  I understand the contents of this consent, which adequately explains the benefits and risks of the Services being provided via telemedicine.   I have been provided ample opportunity to ask questions regarding this consent and the Services and have had my questions answered to my satisfaction.  I give my informed consent for the services to be provided through the use of telemedicine in my medical care  By participating in this telemedicine visit I agree to the above.  PTS SON LEE Staib WHO IS MEDICAL POA AND ON DPR GAVE THE VERBAL CONSENT FOR  DR Meda Coffee TO TREAT THIS PT WHILE SHE IS AT HER NURSING HOME VIA TELEPHONE VISIT ON 9/10.

## 2018-12-21 NOTE — Progress Notes (Addendum)
Virtual Visit via Telephone Note   This visit type was conducted due to national recommendations for restrictions regarding the COVID-19 Pandemic (e.g. social distancing) in an effort to limit this patient's exposure and mitigate transmission in our community.  Due to her co-morbid illnesses, this patient is at least at moderate risk for complications without adequate follow up.  This format is felt to be most appropriate for this patient at this time.  The patient did not have access to video technology/had technical difficulties with video requiring transitioning to audio format only (telephone).  All issues noted in this document were discussed and addressed.  No physical exam could be performed with this format.  Please refer to the patient's chart for her  consent to telehealth for [facility].   Date:  12/22/2018   ID:  Tammy Vaughn, DOB 11/03/1924, MRN [id number]  Patient Location: Home Provider Location: Home  PCP:  Patient, No Pcp Per  Cardiologist:  Ena Dawley, MD  Electrophysiologist:  None   Evaluation Performed:  Follow-Up Visit  Chief Complaint: No complaints, 6 months follow-up  History of Present Illness:    Tammy Vaughn is a 85 y.o. female with h/o persistent AF, HTN, CKD III, chronic diastolic CHF and pulmonary hypertension. She is on chronic coumadin with INRs followed at her senior living community.  The patient is coming after 6 months, she fell in July of 2019.  05/18/2018 - her appetite has decreased and she has lost 20 pounds in the last 4 months.  She denies any lower extremity edema orthopnea proximal nocturnal dyspnea.  She has not had any chest pain, unfortunately she fell again yesterday and broke a finger on her right hand.  She denies any bleeding.  12/22/2018 - 6 months follow up, the patient remains very healthy, she is currently on lockdown in nursing home and unable to come to the clinic in person.  She is enjoying her time there, she is able to  walk, has bunch of good friends with whom she walks outside, she keeps herself occupied by reading.  She denies any lower extremity edema orthopnea proximal nocturnal dyspnea.  She has no orthostatic hypotension, no falls.  She has good appetite.  The patient does not have symptoms concerning for COVID-19 infection (fever, chills, cough, or new shortness of breath).   Past Medical History:  Diagnosis Date  . Aortic regurgitation   . Arthritis   . Burst fracture of thoracic vertebra (HCC)    a. stable burst fracture t9-t10  . Chronic diastolic CHF (congestive heart failure) ([place])   . CKD (chronic kidney disease), stage III ([place])    a. Hypertensive CKD.  . Diverticulosis of large intestine   . Essential hypertension   . High cholesterol   . History of skin cancer   . Hx of right knee surgery   . Iron deficiency   . Mitral regurgitation   . Osteoarthritis   . Persistent atrial fibrillation    a. 04/2014 Echo: EF 55-60%, mild LVH, mild to mod AI/MR, sev dil LA, mildly reduced RV fxn, sev dil RA, mild-mod TR, PASP 76mmHg.  Marland Kitchen Pulmonary hypertension ([place])    a. 04/2014 Echo: PASP 57mmHg.  . Tricuspid regurgitation    Past Surgical History:  Procedure Laterality Date  . ABDOMINAL HYSTERECTOMY    . CESAREAN SECTION    . HIP SURGERY Bilateral   . TOTAL KNEE ARTHROPLASTY Right 06/2011     Current Meds  Medication Sig  . acetaminophen (TYLENOL)  325 MG tablet Take 325 mg by mouth 2 (two) times daily as needed (pain).   . carvedilol (COREG) 12.5 MG tablet Take 1 tablet (12.5 mg total) by mouth 2 (two) times daily with a meal.  . diltiazem (CARDIZEM) 60 MG tablet Take 60 mg by mouth daily.  . furosemide (LASIX) 40 MG tablet Take 0.5 tablets (20 mg total) by mouth daily.  . potassium chloride (K-DUR) 10 MEQ tablet Take 10 mEq by mouth daily.  Marland Kitchen senna (SENOKOT) 8.6 MG tablet Take 1 tablet by mouth 2 (two) times daily.   Marland Kitchen spironolactone (ALDACTONE) 25 MG tablet Take 0.5 tablets (12.5 mg total)  by mouth daily.  Marland Kitchen warfarin (COUMADIN) 2.5 MG tablet Take 2.5 mg by mouth once a week. Pt takes this dose on Sundays only.  . warfarin (COUMADIN) 5 MG tablet Take 5 mg by mouth as directed. Takes this [place].  . [DISCONTINUED] diltiazem (CARDIZEM) 60 MG tablet TAKE ONE (1) TABLET EACH DAY AS NEEDED FOR ATRIAL FIBRILLATION (Patient taking differently: Take 60 mg by mouth. )    Allergies:   Morphine, Morphine and related, Tape, Amlodipine, and Codeine   Social History   Tobacco Use  . Smoking status: Never Smoker  . Smokeless tobacco: Never Used  Substance Use Topics  . Alcohol use: No  . Drug use: No    Family Hx: The patient's family history includes Hypertension in her brother; Stroke in her mother. There is no history of Heart attack.  ROS:   Please see the history of present illness.    All other systems reviewed and are negative.  Prior CV studies:   The following studies were reviewed today:  Labs/Other Tests and Data Reviewed:    EKG:  No ECG reviewed.  Recent Labs: 05/18/2018: BUN 19; Creatinine, Ser 1.18; Hemoglobin 13.3; NT-Pro BNP 2,082; Platelets 130; Potassium 4.5; Sodium 138   Recent Lipid Panel No results found for: CHOL, TRIG, HDL, CHOLHDL, LDLCALC, LDLDIRECT   Weight 12/22/2018:  157 lbs  Wt Readings from Last 3 Encounters:  05/18/18 143 lb (64.9 kg)  01/24/18 164 lb 12.8 oz (74.8 kg)  09/22/17 175 lb (79.4 kg)     Objective:    Vital Signs:  123/77 mmHg, HR 60 (prior to taking today's meds)  VITAL SIGNS:  reviewed    ASSESSMENT & PLAN:    1. Chronic diastolic CHF: per nurse she is euvolemic, I will continue low-dose spironolactone.  I would like to follow-up this patient in person in 3 months, and obtain BMP and BNP at the time.  2. Essential hypertension -blood pressure rather low for age, her vitals were checked today before taking her meds, I am going to discontinue diltiazem..  3. CKD [id number] Crea improved from 1.5- 1.7 -->  1.18, will continue only spironolactone 12.5 mg p.o. daily.  4. HL:We discontinued simvastatin secondary to some memory loss.  She is doing great enjoying her life with fairly good memory.  5. Persistent AFib: Rate controlled chads2vasc score is 5.  On coumadin.  No bleeding, most recent hemoglobin 13.3 in February 2020.  Her heart rate today was 60 prior to taking her labs, I am going to discontinue diltiazem and continue carvedilol.  COVID-19 Education: The signs and symptoms of COVID-19 were discussed with the patient and how to seek care for testing (follow up with PCP or arrange E-visit).  The importance of social distancing was discussed today.  Time:   Today, I have spent 25 minutes with the  patient with telehealth technology discussing the above problems.     Medication Adjustments/Labs and Tests Ordered: Current medicines are reviewed at length with the patient today.  Concerns regarding medicines are outlined above.   Tests Ordered: No orders of the defined types were placed in this encounter.   Medication Changes: No orders of the defined types were placed in this encounter.   Follow Up:  In Person in 6 month(s)  Signed, Ena Dawley, MD  12/22/2018 9:54 AM    [place]

## 2018-12-22 ENCOUNTER — Other Ambulatory Visit: Payer: Self-pay

## 2018-12-22 ENCOUNTER — Telehealth (INDEPENDENT_AMBULATORY_CARE_PROVIDER_SITE_OTHER): Payer: Medicare Other | Admitting: Cardiology

## 2018-12-22 DIAGNOSIS — Z7901 Long term (current) use of anticoagulants: Secondary | ICD-10-CM | POA: Diagnosis not present

## 2018-12-22 DIAGNOSIS — I4811 Longstanding persistent atrial fibrillation: Secondary | ICD-10-CM

## 2018-12-22 DIAGNOSIS — I5033 Acute on chronic diastolic (congestive) heart failure: Secondary | ICD-10-CM

## 2018-12-22 DIAGNOSIS — I10 Essential (primary) hypertension: Secondary | ICD-10-CM | POA: Diagnosis not present

## 2018-12-22 NOTE — Patient Instructions (Addendum)
Medication Instructions:   STOP TAKING DILTIAZEM NOW--DR NELSON ENDORSED THIS TO THE RN AT [place] PHONE  If you need a refill on your cardiac medications before your next appointment, please call your pharmacy.    Lab work:  ON March 15, 2019 AT 3 PM RIGHT BEFORE YOU SEE DR Meda Coffee IN THE CLINIC THAT DAY--WE WILL CHECK A BMET AND PRO-BNP  If you have labs (blood work) drawn today and your tests are completely normal, you will receive your results only by: Marland Kitchen MyChart Message (if you have MyChart) OR . A paper copy in the mail If you have any lab test that is abnormal or we need to change your treatment, we will call you to review the results.    Follow-Up:  3 MONTHS AS A REGULAR OFFICE VISIT WITH DR Meda Coffee ON March 15, 2019 AT 3:20 PM.  YOU WILL COME IN FOR LAB WORK SAME DAY RIGHT BEFORE THIS APPOINTMENT AT 3:00 PM IN OUR OFFICE LAB.

## 2018-12-22 NOTE — Addendum Note (Signed)
Addended by: Nuala Alpha on: 12/22/2018 10:22 AM   Modules accepted: Orders

## 2019-03-15 ENCOUNTER — Ambulatory Visit: Payer: Medicare Other | Admitting: Cardiology

## 2019-03-15 ENCOUNTER — Other Ambulatory Visit: Payer: Medicare Other

## 2019-03-16 ENCOUNTER — Other Ambulatory Visit: Payer: Medicare Other

## 2019-03-16 ENCOUNTER — Ambulatory Visit: Payer: Medicare Other | Admitting: Cardiology

## 2019-12-18 ENCOUNTER — Other Ambulatory Visit: Payer: Self-pay | Admitting: Cardiology

## 2019-12-18 DIAGNOSIS — I4811 Longstanding persistent atrial fibrillation: Secondary | ICD-10-CM

## 2019-12-18 DIAGNOSIS — I5033 Acute on chronic diastolic (congestive) heart failure: Secondary | ICD-10-CM

## 2019-12-18 DIAGNOSIS — Z7901 Long term (current) use of anticoagulants: Secondary | ICD-10-CM

## 2019-12-18 DIAGNOSIS — I10 Essential (primary) hypertension: Secondary | ICD-10-CM

## 2021-12-12 DEATH — deceased
